# Patient Record
Sex: Male | Born: 1975 | Race: White | Hispanic: No | Smoking: Current every day smoker
Health system: Southern US, Community
[De-identification: ages and names within clinical notes are randomized; demographics above are authoritative.]

## PROBLEM LIST (undated history)

## (undated) DIAGNOSIS — F32A Depression, unspecified: Secondary | ICD-10-CM

## (undated) HISTORY — PX: CHOLECYSTECTOMY: SHX55

---

## 2005-04-09 ENCOUNTER — Other Ambulatory Visit: Payer: Self-pay

## 2005-04-09 ENCOUNTER — Emergency Department: Payer: Self-pay | Admitting: Emergency Medicine

## 2009-02-26 ENCOUNTER — Emergency Department: Payer: Self-pay | Admitting: Emergency Medicine

## 2014-09-02 ENCOUNTER — Emergency Department: Payer: Self-pay | Admitting: Emergency Medicine

## 2014-09-05 LAB — BETA STREP CULTURE(ARMC)

## 2020-06-25 ENCOUNTER — Emergency Department: Payer: Self-pay

## 2020-06-25 ENCOUNTER — Encounter: Payer: Self-pay | Admitting: Emergency Medicine

## 2020-06-25 ENCOUNTER — Emergency Department
Admission: EM | Admit: 2020-06-25 | Discharge: 2020-06-25 | Disposition: A | Discharge status: Home / Self Care | Payer: Self-pay | Attending: Emergency Medicine | Admitting: Emergency Medicine

## 2020-06-25 ENCOUNTER — Other Ambulatory Visit: Payer: Self-pay

## 2020-06-25 DIAGNOSIS — M545 Low back pain, unspecified: Secondary | ICD-10-CM | POA: Insufficient documentation

## 2020-06-25 DIAGNOSIS — F17200 Nicotine dependence, unspecified, uncomplicated: Secondary | ICD-10-CM | POA: Insufficient documentation

## 2020-06-25 MED ORDER — BACLOFEN 5 MG PO TABS: 5.0000 mg | ORAL_TABLET | Freq: Three times a day (TID) | ORAL | 0 refills | Status: AC | PRN

## 2020-06-25 MED ORDER — KETOROLAC TROMETHAMINE 30 MG/ML IJ SOLN
30.0000 mg | Freq: Once | INTRAMUSCULAR | Status: AC
  Administered 2020-06-25: 30 mg via INTRAMUSCULAR
  Filled 2020-06-25: qty 1

## 2020-06-25 MED ORDER — LIDOCAINE 5 % EX PTCH
1.0000 | MEDICATED_PATCH | CUTANEOUS | Status: DC
  Administered 2020-06-25: 1 via TRANSDERMAL
  Filled 2020-06-25: qty 1

## 2020-06-25 MED ORDER — LIDOCAINE 5 % EX PTCH: 1.0000 | MEDICATED_PATCH | Freq: Two times a day (BID) | CUTANEOUS | 0 refills | Status: AC

## 2020-06-25 NOTE — ED Triage Notes (Signed)
Pt c/o lower back pain x 2 weeks, and has been increasingly worse x 1 week. Pt states has tried heat, OTC pain relief without relief. Pt A&O, visualized in NAD at this time.

## 2020-06-25 NOTE — ED Provider Notes (Signed)
[facility] Emergency Department Provider Note  ____________________________________________  Time seen: Approximately 3:29 PM  I have reviewed the triage vital signs and the nursing notes.   HISTORY  Chief Complaint Back Pain    HPI Chris Walsh is a 44 y.o. male that presents to the emergency department for evaluation of low back pain for 2 weeks.  Patient states that at first pain was intermittent, and for the last week pain has been constant.  Pain is throbbing throughout his low back but does not radiate.  Pain improves when he uses his hands to provide support to his back.  Pain is worse with movement.  He has difficulty getting comfortable when going to bed due to discomfort.  He has taken Aleve without relief.  No trauma.  No bowel or bladder dysfunction or saddle anesthesias.  No nausea, vomiting, abdominal pain, urinary symptoms, leg pain, numbness, tingling.   History reviewed. No pertinent past medical history.  There are no problems to display for this patient.   Past Surgical History:  Procedure Laterality Date  . CHOLECYSTECTOMY      Prior to Admission medications   Medication Sig Start Date End Date Taking? Authorizing Provider  Baclofen 5 MG TABS Take 5 mg by mouth 3 (three) times daily as needed. 06/25/20   Enid Derry, PA-C  lidocaine (LIDODERM) 5 % Place 1 patch onto the skin every 12 (twelve) hours. Remove & Discard patch within 12 hours or as directed by MD 06/25/20 06/25/21  Enid Derry, PA-C    Allergies Patient has no known allergies.  No family history on file.  Social History Social History   Tobacco Use  . Smoking status: Current Every Day Smoker  . Smokeless tobacco: Never Used  Substance Use Topics  . Alcohol use: Not Currently  . Drug use: Not Currently     Review of Systems  Constitutional: No fever/chills Cardiovascular: No chest pain. Respiratory: No SOB. Gastrointestinal: No abdominal pain.  No  nausea, no vomiting.  Genitourinary: Negative for dysuria. Musculoskeletal: Positive for low back pain. Skin: Negative for rash, abrasions, lacerations, ecchymosis. Neurological: Negative for headaches, numbness or tingling   ____________________________________________   PHYSICAL EXAM:  VITAL SIGNS: ED Triage Vitals  Enc Vitals Group     BP 06/25/20 1434 134/85     Pulse Rate 06/25/20 1434 96     Resp 06/25/20 1434 20     Temp 06/25/20 1434 98.1 F (36.7 C)     Temp Source 06/25/20 1434 Oral     SpO2 06/25/20 1434 97 %     Weight 06/25/20 1432 190 lb (86.2 kg)     Height 06/25/20 1432 5\' 10"  (1.778 m)     Head Circumference --      Peak Flow --      Pain Score 06/25/20 1432 8     Pain Loc --      Pain Edu? --      Excl. in GC? --      Constitutional: Alert and oriented. Well appearing and in no acute distress. Eyes: Conjunctivae are normal. PERRL. EOMI. Head: Atraumatic. ENT:      Ears:      Nose: No congestion/rhinnorhea.      Mouth/Throat: Mucous membranes are moist.  Neck: No stridor.   Cardiovascular: Normal rate, regular rhythm.  Good peripheral circulation. Respiratory: Normal respiratory effort without tachypnea or retractions. Lungs CTAB. Good air entry to the bases with no decreased or absent breath sounds. Gastrointestinal: Bowel  sounds 4 quadrants. Soft and nontender to palpation. No guarding or rigidity. No palpable masses. No distention.  Musculoskeletal: Full range of motion to all extremities. No gross deformities appreciated.  Mild diffuse tenderness to palpation throughout lumbar spine and lumbar paraspinal muscles.  Strength equal in lower extremities bilaterally.  Normal gait. Neurologic:  Normal speech and language. No gross focal neurologic deficits are appreciated.  Skin:  Skin is warm, dry and intact. No rash noted. Psychiatric: Mood and affect are normal. Speech and behavior are normal. Patient exhibits appropriate insight and  judgement.   ____________________________________________   LABS (all labs ordered are listed, but only abnormal results are displayed)  Labs Reviewed - No data to display ____________________________________________  EKG   ____________________________________________  RADIOLOGY Lexine Baton, personally viewed and evaluated these images (plain radiographs) as part of my medical decision making, as well as reviewing the written report by the radiologist.  DG Lumbar Spine 2-3 Views  Result Date: 06/25/2020 CLINICAL DATA:  Back pain for 2 weeks EXAM: LUMBAR SPINE - 2-3 VIEW COMPARISON:  None. FINDINGS: Lumbar alignment is normal. Vertebral body heights are maintained. Mild degenerative changes at L3-L4 and L4-L5. IMPRESSION: Mild degenerative changes at L3-L4 and L4-L5. Electronically Signed   By: Jasmine Pang M.D.   On: 06/25/2020 16:05    ____________________________________________    PROCEDURES  Procedure(s) performed:    Procedures    Medications  ketorolac (TORADOL) 30 MG/ML injection 30 mg (30 mg Intramuscular Given 06/25/20 1635)     ____________________________________________   INITIAL IMPRESSION / ASSESSMENT AND PLAN / ED COURSE  Pertinent labs & imaging results that were available during my care of the patient were reviewed by me and considered in my medical decision making (see chart for details).  Review of the [place] CSRS was performed in accordance of the [facility] prior to dispensing any controlled drugs.     Patient presented to emergency department for evaluation of low back pain.  Vital signs and exam are reassuring.  X-ray negative for acute bony abnormality.  Patient was given IM Toradol for pain.  Patient will be discharged home with prescriptions for baclofen and Lidoderm. Patient is to follow up with primary care as directed. Patient is given ED precautions to return to the ED for any worsening or new symptoms.   Chris Walsh was  evaluated in Emergency Department on 06/25/2020 for the symptoms described in the history of present illness. He was evaluated in the context of the global COVID-19 pandemic, which necessitated consideration that the patient might be at risk for infection with the SARS-CoV-2 virus that causes COVID-19. Institutional protocols and algorithms that pertain to the evaluation of patients at risk for COVID-19 are in a state of rapid change based on information released by regulatory bodies including the CDC and federal and state organizations. These policies and algorithms were followed during the patient's care in the ED.  ____________________________________________  FINAL CLINICAL IMPRESSION(S) / ED DIAGNOSES  Final diagnoses:  Acute bilateral low back pain without sciatica      NEW MEDICATIONS STARTED DURING THIS VISIT:  ED Discharge Orders         Ordered    Baclofen 5 MG TABS  3 times daily PRN        06/25/20 1703    lidocaine (LIDODERM) 5 %  Every 12 hours        06/25/20 1703              This chart was dictated  using voice recognition software/Dragon. Despite best efforts to proofread, errors can occur which can change the meaning. Any change was purely unintentional.    Enid Derry, PA-C 06/25/20 2300    Phineas Semen, MD 06/26/20 [phone]

## 2020-06-25 NOTE — ED Triage Notes (Signed)
Pt called for triage, no response. 

## 2020-08-17 ENCOUNTER — Other Ambulatory Visit: Payer: Self-pay

## 2020-08-17 ENCOUNTER — Emergency Department
Admission: EM | Admit: 2020-08-17 | Discharge: 2020-08-18 | Disposition: A | Discharge status: Home / Self Care | Payer: Self-pay | Attending: Emergency Medicine | Admitting: Emergency Medicine

## 2020-08-17 ENCOUNTER — Encounter: Payer: Self-pay | Admitting: Emergency Medicine

## 2020-08-17 DIAGNOSIS — Z20822 Contact with and (suspected) exposure to covid-19: Secondary | ICD-10-CM | POA: Insufficient documentation

## 2020-08-17 DIAGNOSIS — F1490 Cocaine use, unspecified, uncomplicated: Secondary | ICD-10-CM | POA: Insufficient documentation

## 2020-08-17 DIAGNOSIS — F17200 Nicotine dependence, unspecified, uncomplicated: Secondary | ICD-10-CM | POA: Insufficient documentation

## 2020-08-17 DIAGNOSIS — R45851 Suicidal ideations: Secondary | ICD-10-CM | POA: Insufficient documentation

## 2020-08-17 DIAGNOSIS — F1410 Cocaine abuse, uncomplicated: Secondary | ICD-10-CM

## 2020-08-17 DIAGNOSIS — F329 Major depressive disorder, single episode, unspecified: Secondary | ICD-10-CM

## 2020-08-17 HISTORY — DX: Depression, unspecified: F32.A

## 2020-08-17 LAB — COMPREHENSIVE METABOLIC PANEL
ALT: 21 U/L (ref 0–44)
AST: 22 U/L (ref 15–41)
Albumin: 3.7 g/dL (ref 3.5–5.0)
Alkaline Phosphatase: 69 U/L (ref 38–126)
Anion gap: 11 (ref 5–15)
BUN: 9 mg/dL (ref 6–20)
CO2: 24 mmol/L (ref 22–32)
Calcium: 8.9 mg/dL (ref 8.9–10.3)
Chloride: 99 mmol/L (ref 98–111)
Creatinine, Ser: 1.09 mg/dL (ref 0.61–1.24)
GFR, Estimated: >60 mL/min (ref >60)
Glucose, Bld: 135 mg/dL — ABNORMAL HIGH (ref 70–99)
Potassium: 3.6 mmol/L (ref 3.5–5.1)
Sodium: 134 mmol/L — ABNORMAL LOW (ref 135–145)
Total Bilirubin: 0.7 mg/dL (ref 0.3–1.2)
Total Protein: 6.7 g/dL (ref 6.5–8.1)

## 2020-08-17 LAB — CBC
HCT: 45.6 % (ref 39.0–52.0)
Hemoglobin: 16.7 g/dL (ref 13.0–17.0)
MCH: 31.9 pg (ref 26.0–34.0)
MCHC: 36.6 g/dL — ABNORMAL HIGH (ref 30.0–36.0)
MCV: 87.2 fL (ref 80.0–100.0)
Platelets: 330 10*3/uL (ref 150–400)
RBC: 5.23 MIL/uL (ref 4.22–5.81)
RDW: 11.7 % (ref 11.5–15.5)
WBC: 17.8 10*3/uL — ABNORMAL HIGH (ref 4.0–10.5)
nRBC: 0 % (ref 0.0–0.2)

## 2020-08-17 LAB — RESP PANEL BY RT-PCR (FLU A&B, COVID) ARPGX2
Influenza A by PCR: NEGATIVE
Influenza B by PCR: NEGATIVE
SARS Coronavirus 2 by RT PCR: NEGATIVE

## 2020-08-17 LAB — SALICYLATE LEVEL: Salicylate Lvl: <7 mg/dL — ABNORMAL LOW (ref 7.0–30.0)

## 2020-08-17 LAB — URINE DRUG SCREEN, QUALITATIVE (ARMC ONLY)
Amphetamines, Ur Screen: NOT DETECTED
Barbiturates, Ur Screen: NOT DETECTED
Benzodiazepine, Ur Scrn: NOT DETECTED
Cannabinoid 50 Ng, Ur ~~LOC~~: NOT DETECTED
Cocaine Metabolite,Ur ~~LOC~~: POSITIVE — AB
MDMA (Ecstasy)Ur Screen: NOT DETECTED
Methadone Scn, Ur: NOT DETECTED
Opiate, Ur Screen: NOT DETECTED
Phencyclidine (PCP) Ur S: NOT DETECTED
Tricyclic, Ur Screen: NOT DETECTED

## 2020-08-17 LAB — ETHANOL: Alcohol, Ethyl (B): <10 mg/dL (ref <10)

## 2020-08-17 LAB — ACETAMINOPHEN LEVEL: Acetaminophen (Tylenol), Serum: <10 ug/mL — ABNORMAL LOW (ref 10–30)

## 2020-08-17 NOTE — ED Notes (Signed)
RN in to assess pt. Pt asleep at this time, writer will assess upon pt waking or when immediate assessment indicated

## 2020-08-17 NOTE — ED Triage Notes (Signed)
Pt reports has been feeling really depressed. Pt states lost his dad recently and has had thoughts of self harm, denies that currently, denies plan. Pt looking down when taking to RN

## 2020-08-17 NOTE — BH Assessment (Signed)
Assessment Note  Chris Walsh is an 44 y.o. male presenting to [facility] ED voluntarily for SI. Per triage note Pt reports has been feeling really depressed. Pt states lost his dad recently and has had thoughts of self harm, denies that currently, denies plan. During assessment patient appears alert and oriented x4, calm and cooperative, appears depressed and sad. Patient reports why he is presenting to the ED "I'm going through a lot, my dad passed of Cancer in September." Patient also reports "I used to see a Psychiatrist that wanted to start me on medication but I didn't follow through with it." Patient reports if discharged he will follow through with outpatient treatment. Patient reports the reason he came to the ED tonight "I live with my mom and I was telling her that I felt like hurting myself, I've been isolating myself." Patient also reports occasional Cocaine use. Patient UDS positive for Cocaine. Patient denies current SI/HI/AH/VH and does not appear to be responding to internal or external stimuli.  Per Psyc NP Lerry Liner patient will be observed overnight and reassessed in the morning  Diagnosis: Depression, Cocaine Use  Past Medical History:  Past Medical History:  Diagnosis Date  . Depression     Past Surgical History:  Procedure Laterality Date  . CHOLECYSTECTOMY      Family History: No family history on file.  Social History:  reports that he has been smoking. He has never used smokeless tobacco. He reports previous alcohol use. He reports previous drug use.  Additional Social History:  Alcohol / Drug Use Pain Medications: See MAR Prescriptions: See MAR Over the Counter: See MAR History of alcohol / drug use?: Yes Substance #1 Name of Substance 1: Cocaine  CIWA: CIWA-Ar BP: (!) 146/71 Pulse Rate: 95 COWS:    Allergies: No Known Allergies  Home Medications: (Not in a hospital admission)   OB/GYN Status:  No LMP for male patient.  General Assessment  Data Location of Assessment: [facility] ED TTS Assessment: In system Is this a Tele or Face-to-Face Assessment?: Face-to-Face Is this an Initial Assessment or a Re-assessment for this encounter?: Initial Assessment Patient Accompanied by:: N/A Language Other than English: No Living Arrangements: Other (Comment) What gender do you identify as?: Male Marital status: Single Pregnancy Status: No Living Arrangements: Parent Can pt return to current living arrangement?: Yes Admission Status: Voluntary Is patient capable of signing voluntary admission?: Yes Referral Source: Self/Family/Friend Insurance type: None  Medical Screening Exam [facility] Walk-in ONLY) Medical Exam completed: Yes  Crisis Care Plan Living Arrangements: Parent Legal Guardian: Other: (Self) Name of Psychiatrist: None Name of Therapist: None  Education Status Is patient currently in school?: No Is the patient employed, unemployed or receiving disability?: Unemployed  Risk to self with the past 6 months Suicidal Ideation: No-Not Currently/Within Last 6 Months Has patient been a risk to self within the past 6 months prior to admission? : Yes Suicidal Intent: No-Not Currently/Within Last 6 Months Has patient had any suicidal intent within the past 6 months prior to admission? : No Is patient at risk for suicide?: No Suicidal Plan?: No Has patient had any suicidal plan within the past 6 months prior to admission? : No Access to Means: No What has been your use of drugs/alcohol within the last 12 months?: Cocaine Previous Attempts/Gestures: No How many times?: 0 Other Self Harm Risks: None Triggers for Past Attempts: None known Intentional Self Injurious Behavior: None Family Suicide History: Unknown Recent stressful life event(s): Other (Comment) (Loss  of family member) Persecutory voices/beliefs?: No Depression: Yes Depression Symptoms: Isolating,Insomnia,Loss of interest in usual pleasures,Feeling worthless/self  pity Substance abuse history and/or treatment for substance abuse?: Yes Suicide prevention information given to non-admitted patients: Not applicable  Risk to Others within the past 6 months Homicidal Ideation: No Does patient have any lifetime risk of violence toward others beyond the six months prior to admission? : No Thoughts of Harm to Others: No Current Homicidal Intent: No Current Homicidal Plan: No Access to Homicidal Means: No Identified Victim: None History of harm to others?: No Assessment of Violence: None Noted Violent Behavior Description: None Does patient have access to weapons?: No Criminal Charges Pending?: No Does patient have a court date: No Is patient on probation?: No  Psychosis Hallucinations: None noted Delusions: None noted  Mental Status Report Appearance/Hygiene: In scrubs Eye Contact: Good Motor Activity: Freedom of movement Speech: Logical/coherent Level of Consciousness: Alert Mood: Depressed Affect: Appropriate to circumstance Anxiety Level: None Thought Processes: Coherent Judgement: Unimpaired Orientation: Person,Place,Time,Situation,Appropriate for developmental age Obsessive Compulsive Thoughts/Behaviors: None  Cognitive Functioning Concentration: Normal Memory: Recent Intact,Remote Intact Is patient IDD: No Insight: Good Impulse Control: Good Appetite: Good Have you had any weight changes? : No Change Sleep: Decreased Total Hours of Sleep: 5 Vegetative Symptoms: None  ADLScreening [facility] Assessment Services) Patient's cognitive ability adequate to safely complete daily activities?: Yes Patient able to express need for assistance with ADLs?: Yes Independently performs ADLs?: Yes (appropriate for developmental age)  Prior Inpatient Therapy Prior Inpatient Therapy: No  Prior Outpatient Therapy Prior Outpatient Therapy: No Does patient have an ACCT team?: No Does patient have Intensive In-House Services?  : No Does patient  have [facility] services? : No Does patient have P4CC services?: No  ADL Screening (condition at time of admission) Patient's cognitive ability adequate to safely complete daily activities?: Yes Is the patient deaf or have difficulty hearing?: No Does the patient have difficulty seeing, even when wearing glasses/contacts?: No Does the patient have difficulty concentrating, remembering, or making decisions?: No Patient able to express need for assistance with ADLs?: Yes Does the patient have difficulty dressing or bathing?: No Independently performs ADLs?: Yes (appropriate for developmental age) Does the patient have difficulty walking or climbing stairs?: No Weakness of Legs: None Weakness of Arms/Hands: None  Home Assistive Devices/Equipment Home Assistive Devices/Equipment: None  Therapy Consults (therapy consults require a physician order) PT Evaluation Needed: No OT Evalulation Needed: No SLP Evaluation Needed: No                  Disposition: Per Psyc NP Rashaun Dixon patient will be observed overnight and reassessed in the morning Disposition Initial Assessment Completed for this Encounter: Yes  On Site Evaluation by:   Reviewed with Physician:    Benay Pike MS LCASA 08/17/2020 9:26 PM

## 2020-08-17 NOTE — BH Assessment (Signed)
TTS unable to assess, as patient is sleeping and cannot be aroused enough to participate with the assessment.  

## 2020-08-17 NOTE — ED Notes (Signed)
Pt given dinner tray.

## 2020-08-17 NOTE — ED Provider Notes (Signed)
[facility] Emergency Department Provider Note   ____________________________________________    I have reviewed the triage vital signs and the nursing notes.   HISTORY  Chief Complaint Depression     HPI Chris Walsh is a 44 y.o. male with a history of depression who presents today with complaints of intermittent feelings of depression over the last month.  He has had vague thoughts of harming himself but reports he would not act on them.  He reports when he feels this way he talks with his mother which usually makes him feel better, she recommended he come speak to someone today.  He notes he has been hospitalized before in [place] but not [place] in [place].  Reports he used to see a psychiatrist but no longer.  Past Medical History:  Diagnosis Date  . Depression     There are no problems to display for this patient.   Past Surgical History:  Procedure Laterality Date  . CHOLECYSTECTOMY      Prior to Admission medications   Medication Sig Start Date End Date Taking? Authorizing Provider  Baclofen 5 MG TABS Take 5 mg by mouth 3 (three) times daily as needed. 06/25/20   Enid Derry, PA-C  lidocaine (LIDODERM) 5 % Place 1 patch onto the skin every 12 (twelve) hours. Remove & Discard patch within 12 hours or as directed by MD 06/25/20 06/25/21  Enid Derry, PA-C     Allergies Patient has no known allergies.  No family history on file.  Social History Social History   Tobacco Use  . Smoking status: Current Every Day Smoker  . Smokeless tobacco: Never Used  Substance Use Topics  . Alcohol use: Not Currently  . Drug use: Not Currently    Review of Systems  Constitutional: No fever/chills Eyes: No visual changes.  ENT: No sore throat. Cardiovascular: Denies chest pain. Respiratory: Denies shortness of breath. Gastrointestinal: No abdominal pain.    Genitourinary: Negative for dysuria. Musculoskeletal: Negative for  back pain. Skin: Negative for rash. Neurological: Negative for headaches or weakness   ____________________________________________   PHYSICAL EXAM:  VITAL SIGNS: ED Triage Vitals  Enc Vitals Group     BP 08/17/20 1433 (!) 146/71     Pulse Rate 08/17/20 1433 95     Resp 08/17/20 1433 20     Temp 08/17/20 1434 97.8 F (36.6 C)     Temp Source 08/17/20 1434 Oral     SpO2 08/17/20 1433 96 %     Weight 08/17/20 1433 88.5 kg (195 lb)     Height 08/17/20 1433 1.676 m (5\' 6" )     Head Circumference --      Peak Flow --      Pain Score 08/17/20 1433 0     Pain Loc --      Pain Edu? --      Excl. in GC? --     Constitutional: Alert and oriented.   Nose: No congestion/rhinnorhea. Mouth/Throat: Mucous membranes are moist.   Neck:  Painless ROM Cardiovascular: Normal rate, regular rhythm. Grossly normal heart sounds.  Good peripheral circulation. Respiratory: Normal respiratory effort.  No retractions. Lungs CTAB. Gastrointestinal: Soft and nontender. No distention.  No CVA tenderness. Genitourinary: deferred Musculoskeletal: No lower extremity tenderness nor edema.  Warm and well perfused Neurologic:  Normal speech and language. No gross focal neurologic deficits are appreciated.  Skin:  Skin is warm, dry and intact. No rash noted. Psychiatric: Mood and affect are  normal. Speech and behavior are normal.  Overall reassuring exam  ____________________________________________   LABS (all labs ordered are listed, but only abnormal results are displayed)  Labs Reviewed  COMPREHENSIVE METABOLIC PANEL - Abnormal; Notable for the following components:      Result Value   Sodium 134 (*)    Glucose, Bld 135 (*)    All other components within normal limits  SALICYLATE LEVEL - Abnormal; Notable for the following components:   Salicylate Lvl <7.0 (*)    All other components within normal limits  ACETAMINOPHEN LEVEL - Abnormal; Notable for the following components:   Acetaminophen  (Tylenol), Serum <10 (*)    All other components within normal limits  CBC - Abnormal; Notable for the following components:   WBC 17.8 (*)    MCHC 36.6 (*)    All other components within normal limits  URINE DRUG SCREEN, QUALITATIVE ([facility] ONLY) - Abnormal; Notable for the following components:   Cocaine Metabolite,[place] POSITIVE (*)    All other components within normal limits  RESP PANEL BY RT-PCR (FLU A&B, COVID) ARPGX2  ETHANOL   ____________________________________________  EKG   ____________________________________________  RADIOLOGY   ____________________________________________   PROCEDURES  Procedure(s) performed: No  Procedures   Critical Care performed: No ____________________________________________   INITIAL IMPRESSION / ASSESSMENT AND PLAN / ED COURSE  Pertinent labs & imaging results that were available during my care of the patient were reviewed by me and considered in my medical decision making (see chart for details).  Patient presents with complaints of depression, intermittent SI over the last month.  No current thoughts of hurting himself, requesting to speak to behavioral team.  He does contract for safety do not engage in to be an immediate threat to himself, did not believe involuntary commitment is indicated at this time.  Have consulted TTS and psychiatry.  Patient with elevated white blood cell count of unknown etiology, exam is quite reassuring, cocaine noted on UDS.  The patient has been placed in psychiatric observation due to the need to provide a safe environment for the patient while obtaining psychiatric consultation and evaluation, as well as ongoing medical and medication management to treat the patient's condition.  The patient has not been placed under full IVC at this time.     ____________________________________________   FINAL CLINICAL IMPRESSION(S) / ED DIAGNOSES  Final diagnoses:  Suicidal ideation        Note:   This document was prepared using Dragon voice recognition software and may include unintentional dictation errors.   Jene Every, MD 08/17/20 1550

## 2020-08-17 NOTE — ED Notes (Signed)
TTS at bedside pt asleep and unable to be assessed at this time

## 2020-08-18 DIAGNOSIS — F331 Major depressive disorder, recurrent, moderate: Secondary | ICD-10-CM

## 2020-08-18 DIAGNOSIS — F332 Major depressive disorder, recurrent severe without psychotic features: Secondary | ICD-10-CM

## 2020-08-18 DIAGNOSIS — R45851 Suicidal ideations: Secondary | ICD-10-CM | POA: Insufficient documentation

## 2020-08-18 DIAGNOSIS — F1410 Cocaine abuse, uncomplicated: Secondary | ICD-10-CM

## 2020-08-18 DIAGNOSIS — F329 Major depressive disorder, single episode, unspecified: Secondary | ICD-10-CM

## 2020-08-18 MED ORDER — QUETIAPINE FUMARATE 100 MG PO TABS: 100.0000 mg | ORAL_TABLET | Freq: Every day | ORAL | 1 refills | Status: AC

## 2020-08-18 NOTE — ED Provider Notes (Signed)
Emergency Medicine Observation Re-evaluation Note  Chris Walsh is a 44 y.o. male, seen on rounds today.  Pt initially presented to the ED for complaints of Depression Currently, the patient is asleep.  Physical Exam  BP 117/61 (BP Location: Left Arm)   Pulse 74   Temp 97.8 F (36.6 C) (Oral)   Resp 18   Ht 5\' 6"  (1.676 m)   Wt 88.5 kg   SpO2 96%   BMI 31.47 kg/m  Physical Exam General: No acute distress Cardiac: Well-perfused extremities Lungs: No respiratory distress Psych: Appropriate mood and affect ED Course / MDM  EKG:    I have reviewed the labs performed to date as well as medications administered while in observation.  Recent changes in the last 24 hours include none.  Plan  Current plan is for reassessment in the morning. Patient is not under full IVC at this time.   , MD 08/18/20 [phone]

## 2020-08-18 NOTE — BH Assessment (Signed)
TTS completed reassessment with NP student Orvilla Fus). Pt presents calm, alert, oriented x 4 and cooperative. Pt reports to feel a lot better today and expressed the need for OPT resources. Pt reports to need OPT resources to address his depression symptoms and recent loss (Dad). Pt was provided a list of OPT resources including [facility]. Pt denies any SI/HI/AH/VH and contracts for safety.   Per Dr. Toni Amend pt will be discharged with the recommendation to follow up with resources provided

## 2020-08-18 NOTE — ED Provider Notes (Signed)
-----------------------------------------   10:19 AM on 08/18/2020 -----------------------------------------  Patient has been seen and evaluated by psychiatry.  They believe the patient is safe for discharge home from a psychiatric standpoint.  Patient's medical work-up has been nonrevealing.  Psychiatry is discharging with a Seroquel prescription and [facility] follow-up.   Minna Antis, MD 08/18/20 1020

## 2020-08-18 NOTE — ED Notes (Signed)
Pt given a cup of sprite per request.  

## 2020-08-18 NOTE — ED Notes (Signed)
Pt given breakfast tray

## 2020-08-18 NOTE — Consult Note (Signed)
[facility] Face-to-Face Psychiatry Consult   Reason for Consult:  Consult for this 44 year old man who came voluntarily to the emergency room with symptoms of mood instability Referring Physician:   Paduchowski Patient Identification: AFFAN CALLOW MRN:  [id number] Principal Diagnosis: MDD (major depressive disorder) Diagnosis:  Principal Problem:   MDD (major depressive disorder) Active Problems:   Cocaine abuse (HCC)   Total Time spent with patient: 1 hour  Subjective:   TYRICE HEWITT is a 44 y.o. male patient admitted with "highs and lows".  HPI:   Patient seen chart reviewed.  44 year old man came to the emergency room complaining of mood instability.  Says that for the past year ever since his father died he has been having moods that go up and down.  Feels negative much of the time.  At the time he came into the emergency room he admitted that he was having suicidal ideation but did not have a specific plan and had not acted on it.  He reports that major stresses include the death of his father last year and that the holidays are depressing.  He also admits however that he has been using cocaine recently.  Mood up and down at times.  Sleep often inadequate.  Denies any psychotic symptoms.  Denies homicidal ideation.  Denies alcohol use.  Admits to ongoing cocaine use.  Not currently receiving any outpatient mental health treatment.  Not threatening to anyone else.  Past Psychiatric History: No previous hospitalization.  Never prescribed medication in the past.  Has been referred to [facility] in the past but did not follow-up with treatment.  No previous suicide attempts  Risk to Self: Suicidal Ideation: No-Not Currently/Within Last 6 Months Suicidal Intent: No-Not Currently/Within Last 6 Months Is patient at risk for suicide?: No Suicidal Plan?: No Access to Means: No What has been your use of drugs/alcohol within the last 12 months?: Cocaine How many times?: 0 Other Self Harm Risks:  None Triggers for Past Attempts: None known Intentional Self Injurious Behavior: None Risk to Others: Homicidal Ideation: No Thoughts of Harm to Others: No Current Homicidal Intent: No Current Homicidal Plan: No Access to Homicidal Means: No Identified Victim: None History of harm to others?: No Assessment of Violence: None Noted Violent Behavior Description: None Does patient have access to weapons?: No Criminal Charges Pending?: No Does patient have a court date: No Prior Inpatient Therapy: Prior Inpatient Therapy: No Prior Outpatient Therapy: Prior Outpatient Therapy: No Does patient have an ACCT team?: No Does patient have Intensive In-House Services?  : No Does patient have [facility] services? : No Does patient have P4CC services?: No  Past Medical History:  Past Medical History:  Diagnosis Date  . Depression     Past Surgical History:  Procedure Laterality Date  . CHOLECYSTECTOMY     Family History: No family history on file. Family Psychiatric  History: Reports that his daughter has bipolar disorder and psychotic symptoms Social History:  Social History   Substance and Sexual Activity  Alcohol Use Not Currently     Social History   Substance and Sexual Activity  Drug Use Not Currently    Social History   Socioeconomic History  . Marital status: Legally Separated    Spouse name: Not on file  . Number of children: Not on file  . Years of education: Not on file  . Highest education level: Not on file  Occupational History  . Not on file  Tobacco Use  . Smoking status: Current  Every Day Smoker  . Smokeless tobacco: Never Used  Substance and Sexual Activity  . Alcohol use: Not Currently  . Drug use: Not Currently  . Sexual activity: Not on file  Other Topics Concern  . Not on file  Social History Narrative  . Not on file   Social Determinants of Health   Financial Resource Strain: Not on file  Food Insecurity: Not on file  Transportation Needs:  Not on file  Physical Activity: Not on file  Stress: Not on file  Social Connections: Not on file   Additional Social History:    Allergies:  No Known Allergies  Labs:  Results for orders placed or performed during the hospital encounter of 08/17/20 (from the past 48 hour(s))  Comprehensive metabolic panel     Status: Abnormal   Collection Time: 08/17/20  2:39 PM  Result Value Ref Range   Sodium 134 (L) 135 - 145 mmol/L   Potassium 3.6 3.5 - 5.1 mmol/L   Chloride 99 98 - 111 mmol/L   CO2 24 22 - 32 mmol/L   Glucose, Bld 135 (H) 70 - 99 mg/dL    Comment: Glucose reference range applies only to samples taken after fasting for at least 8 hours.   BUN 9 6 - 20 mg/dL   Creatinine, Ser 4.09 0.61 - 1.24 mg/dL   Calcium 8.9 8.9 - 81.1 mg/dL   Total Protein 6.7 6.5 - 8.1 g/dL   Albumin 3.7 3.5 - 5.0 g/dL   AST 22 15 - 41 U/L   ALT 21 0 - 44 U/L   Alkaline Phosphatase 69 38 - 126 U/L   Total Bilirubin 0.7 0.3 - 1.2 mg/dL   GFR, Estimated >91 >47 mL/min    Comment: (NOTE) Calculated using the CKD-EPI Creatinine Equation (2021)    Anion gap 11 5 - 15    Comment: Performed at [facility], [address] [place], [place] [address]  Ethanol     Status: None   Collection Time: 08/17/20  2:39 PM  Result Value Ref Range   Alcohol, Ethyl (B) <10 <10 mg/dL    Comment: (NOTE) Lowest detectable limit for serum alcohol is 10 mg/dL.  For medical purposes only. Performed at [facility], [address] [place], [place] [address]   Salicylate level     Status: Abnormal   Collection Time: 08/17/20  2:39 PM  Result Value Ref Range   Salicylate Lvl <7.0 (L) 7.0 - 30.0 mg/dL    Comment: Performed at [facility], [address] [place], [place] [address]  Acetaminophen level     Status: Abnormal   Collection Time: 08/17/20  2:39 PM  Result Value Ref Range   Acetaminophen (Tylenol), Serum <10 (L) 10 - 30 ug/mL    Comment: (NOTE) Therapeutic concentrations  vary significantly. A range of 10-30 ug/mL  may be an effective concentration for many patients. However, some  are best treated at concentrations outside of this range. Acetaminophen concentrations >150 ug/mL at 4 hours after ingestion  and >50 ug/mL at 12 hours after ingestion are often associated with  toxic reactions.  Performed at [facility], [address] [place], [place] [address]   cbc     Status: Abnormal   Collection Time: 08/17/20  2:39 PM  Result Value Ref Range   WBC 17.8 (H) 4.0 - 10.5 K/uL   RBC 5.23 4.22 - 5.81 MIL/uL   Hemoglobin 16.7 13.0 - 17.0 g/dL   HCT 45.6  39.0 - 52.0 %   MCV 87.2 80.0 - 100.0 fL   MCH 31.9 26.0 - 34.0 pg   MCHC 36.6 (H) 30.0 - 36.0 g/dL   RDW 62.9 52.8 - 41.3 %   Platelets 330 150 - 400 K/uL   nRBC 0.0 0.0 - 0.2 %    Comment: Performed at [facility], [address] [place], [place] [address]  Urine Drug Screen, Qualitative     Status: Abnormal   Collection Time: 08/17/20  2:39 PM  Result Value Ref Range   Tricyclic, Ur Screen NONE DETECTED NONE DETECTED   Amphetamines, Ur Screen NONE DETECTED NONE DETECTED   MDMA (Ecstasy)Ur Screen NONE DETECTED NONE DETECTED   Cocaine Metabolite,Ur [place] POSITIVE (A) NONE DETECTED   Opiate, Ur Screen NONE DETECTED NONE DETECTED   Phencyclidine (PCP) Ur S NONE DETECTED NONE DETECTED   Cannabinoid 50 Ng, Ur [place] NONE DETECTED NONE DETECTED   Barbiturates, Ur Screen NONE DETECTED NONE DETECTED   Benzodiazepine, Ur Scrn NONE DETECTED NONE DETECTED   Methadone Scn, Ur NONE DETECTED NONE DETECTED    Comment: (NOTE) Tricyclics + metabolites, urine    Cutoff 1000 ng/mL Amphetamines + metabolites, urine  Cutoff 1000 ng/mL MDMA (Ecstasy), urine              Cutoff 500 ng/mL Cocaine Metabolite, urine          Cutoff 300 ng/mL Opiate + metabolites, urine        Cutoff 300 ng/mL Phencyclidine (PCP), urine         Cutoff 25 ng/mL Cannabinoid, urine                 Cutoff 50  ng/mL Barbiturates + metabolites, urine  Cutoff 200 ng/mL Benzodiazepine, urine              Cutoff 200 ng/mL Methadone, urine                   Cutoff 300 ng/mL  The urine drug screen provides only a preliminary, unconfirmed analytical test result and should not be used for non-medical purposes. Clinical consideration and professional judgment should be applied to any positive drug screen result due to possible interfering substances. A more specific alternate chemical method must be used in order to obtain a confirmed analytical result. Gas chromatography / mass spectrometry (GC/MS) is the preferred confirm atory method. Performed at [facility], [address] [place], [place] [address]   Resp Panel by RT-PCR (Flu A&B, Covid) Nasopharyngeal Swab     Status: None   Collection Time: 08/17/20  3:45 PM   Specimen: Nasopharyngeal Swab; Nasopharyngeal(NP) swabs in vial transport medium  Result Value Ref Range   SARS Coronavirus 2 by RT PCR NEGATIVE NEGATIVE    Comment: (NOTE) SARS-CoV-2 target nucleic acids are NOT DETECTED.  The SARS-CoV-2 RNA is generally detectable in upper respiratory specimens during the acute phase of infection. The lowest concentration of SARS-CoV-2 viral copies this assay can detect is 138 copies/mL. A negative result does not preclude SARS-Cov-2 infection and should not be used as the sole basis for treatment or other patient management decisions. A negative result may occur with  improper specimen collection/handling, submission of specimen other than nasopharyngeal swab, presence of viral mutation(s) within the areas targeted by this assay, and inadequate number of viral copies(<138 copies/mL). A negative result must be combined with clinical observations, patient history, and epidemiological information. The expected result is Negative.  Fact Sheet for Patients:  BloggerCourse.com  Fact Sheet for Healthcare  Providers:  SeriousBroker.it  This test is no t yet approved or cleared by the [place] FDA and  has been authorized for detection and/or diagnosis of SARS-CoV-2 by FDA under an Emergency Use Authorization (EUA). This EUA will remain  in effect (meaning this test can be used) for the duration of the COVID-19 declaration under Section 564(b)(1) of the Act, 21 U.S.C.section 360bbb-3(b)(1), unless the authorization is terminated  or revoked sooner.       Influenza A by PCR NEGATIVE NEGATIVE   Influenza B by PCR NEGATIVE NEGATIVE    Comment: (NOTE) The Xpert Xpress SARS-CoV-2/FLU/RSV plus assay is intended as an aid in the diagnosis of influenza from Nasopharyngeal swab specimens and should not be used as a sole basis for treatment. Nasal washings and aspirates are unacceptable for Xpert Xpress SARS-CoV-2/FLU/RSV testing.  Fact Sheet for Patients: BloggerCourse.com  Fact Sheet for Healthcare Providers: SeriousBroker.it  This test is not yet approved or cleared by the [place] FDA and has been authorized for detection and/or diagnosis of SARS-CoV-2 by FDA under an Emergency Use Authorization (EUA). This EUA will remain in effect (meaning this test can be used) for the duration of the COVID-19 declaration under Section 564(b)(1) of the Act, 21 U.S.C. section 360bbb-3(b)(1), unless the authorization is terminated or revoked.  Performed at [facility], [address] [place], [place] [address]     No current facility-administered medications for this encounter.   Current Outpatient Medications  Medication Sig Dispense Refill  . Baclofen 5 MG TABS Take 5 mg by mouth 3 (three) times daily as needed. 15 tablet 0  . lidocaine (LIDODERM) 5 % Place 1 patch onto the skin every 12 (twelve) hours. Remove & Discard patch within 12 hours or as directed by MD 10 patch 0  . QUEtiapine (SEROQUEL)  100 MG tablet Take 1 tablet (100 mg total) by mouth at bedtime. 30 tablet 1    Musculoskeletal: Strength & Muscle Tone: within normal limits Gait & Station: normal Patient leans: N/A  Psychiatric Specialty Exam: Physical Exam Vitals and nursing note reviewed.  Constitutional:      Appearance: He is well-developed and well-nourished.  HENT:     Head: Normocephalic and atraumatic.  Eyes:     Conjunctiva/sclera: Conjunctivae normal.     Pupils: Pupils are equal, round, and reactive to light.  Cardiovascular:     Heart sounds: Normal heart sounds.  Pulmonary:     Effort: Pulmonary effort is normal.  Abdominal:     Palpations: Abdomen is soft.  Musculoskeletal:        General: Normal range of motion.     Cervical back: Normal range of motion.  Skin:    General: Skin is warm and dry.  Neurological:     General: No focal deficit present.     Mental Status: He is alert.  Psychiatric:        Attention and Perception: Attention normal.        Mood and Affect: Mood normal.        Speech: Speech normal.        Behavior: Behavior normal.        Thought Content: Thought content normal.        Cognition and Memory: Cognition normal.        Judgment: Judgment normal.     Review of Systems  Constitutional: Negative.   HENT: Negative.   Eyes: Negative.   Respiratory: Negative.  Cardiovascular: Negative.   Gastrointestinal: Negative.   Musculoskeletal: Negative.   Skin: Negative.   Neurological: Negative.   Psychiatric/Behavioral: Negative.     Blood pressure 104/61, pulse 82, temperature 98.8 F (37.1 C), temperature source Oral, resp. rate 20, height 5\' 6"  (1.676 m), weight 88.5 kg, SpO2 96 %.Body mass index is 31.47 kg/m.  General Appearance: Casual  Eye Contact:  Good  Speech:  Clear and Coherent  Volume:  Normal  Mood:  Euthymic  Affect:  Congruent  Thought Process:  Goal Directed  Orientation:  Full (Time, Place, and Person)  Thought Content:  Logical  Suicidal  Thoughts:  No  Homicidal Thoughts:  No  Memory:  Immediate;   Fair Recent;   Fair Remote;   Fair  Judgement:  Fair  Insight:  Fair  Psychomotor Activity:  Normal  Concentration:  Concentration: Fair  Recall:  [employer]iservFair  Fund of Knowledge:  Fair  Language:  Fair  Akathisia:  No  Handed:  Right  AIMS (if indicated):     Assets:  Desire for Improvement Housing Resilience  ADL's:  Intact  Cognition:  WNL  Sleep:        Treatment Plan Summary: Plan 44 year old man with symptoms of depression.  Possible bipolar disorder although it sounds like he does not have full psychotic symptoms.  Complicated by substance abuse.  Patient is currently calm pleasant smiling reports that he is feeling much better today.  Denies any current suicidal ideation.  He was happy to meet with the representative from [facility] and make plans for outpatient follow-up.  I suggested to him that we try starting Seroquel 100 mg at night for mood instability sleep problems.  Side effects reviewed.  Patient agreeable.  Case reviewed with emergency room doctor and TTS.  Patient can be discharged from the emergency room to local follow-up.  Disposition: No evidence of imminent risk to self or others at present.   Patient does not meet criteria for psychiatric inpatient admission. Supportive therapy provided about ongoing stressors. Discussed crisis plan, support from social network, calling 911, coming to the Emergency Department, and calling Suicide Hotline.  Mordecai RasmussenJohn Eion Timbrook, MD 08/18/2020 11:41 AM

## 2020-08-18 NOTE — ED Notes (Signed)
Report to bill, rn.  

## 2020-08-18 NOTE — Discharge Instructions (Addendum)
You have been seen in the emergency department for a  psychiatric concern. You have been evaluated both medically as well as psychiatrically. Please follow-up with your outpatient resources provided. Return to the emergency department for any worsening symptoms, or any thoughts of hurting yourself or anyone else so that we may attempt to help you. 

## 2020-08-18 NOTE — Consult Note (Signed)
[facility] Face-to-Face Psychiatry Consult   Reason for Consult:  Psychiatric evaluation Referring Physician:  Dr. Cyril Loosen  Patient Identification: Chris Walsh MRN:  [id number] Principal Diagnosis: MDD (major depressive disorder) Diagnosis:  Principal Problem:   MDD (major depressive disorder)   Total Time spent with patient: 45 minutes  Subjective:   Chris Walsh is a 44 y.o. male patient admitted to [facility] per triage nurse, pt reports has been feeling really depressed. Pt states lost his dad recently and has had thoughts of self harm, denies that currently, denies plan. Pt looking down when taking to RN  HPI:  Assessment  Chris Walsh, 44 y.o., male patient presented to [facility] for suicidal thoughts.  Patient seen face to face  by TTS and this provider; chart reviewed and consulted with Dr. Lucianne Muss on 08/18/20.  On evaluation Chris Walsh reports that he has had intermittent thoughts of suicide.  He states that he has been depressed for a few months.  He recants and becomes tearful when talking about his grandfathers death last year.  He says he knows its time to address his mental health and he is willing to seek outpatient services.    Recommendation:  Although patient, is denying SI currently, writer's recommendation is to be reassessed in the am to insure patient remains psychiatrically stable   Per TTS, during assessment patient appears alert and oriented x4, calm and cooperative, appears depressed and sad. Patient reports why he is presenting to the ED "I'm going through a lot, my dad passed of Cancer in September." Patient also reports "I used to see a Psychiatrist that wanted to start me on medication but I didn't follow through with it." Patient reports if discharged he will follow through with outpatient treatment. Patient reports the reason he came to the ED tonight "I live with my mom and I was telling her that I felt like hurting myself, I've been isolating myself." Patient  also reports occasional Cocaine use. Patient UDS positive for Cocaine. Patient denies current SI/HI/AH/VH and does not appear to be responding to internal or external stimuli.  Past Psychiatric History: MDD  Risk to Self: Suicidal Ideation: No-Not Currently/Within Last 6 Months Suicidal Intent: No-Not Currently/Within Last 6 Months Is patient at risk for suicide?: No Suicidal Plan?: No Access to Means: No What has been your use of drugs/alcohol within the last 12 months?: Cocaine How many times?: 0 Other Self Harm Risks: None Triggers for Past Attempts: None known Intentional Self Injurious Behavior: None Risk to Others: Homicidal Ideation: No Thoughts of Harm to Others: No Current Homicidal Intent: No Current Homicidal Plan: No Access to Homicidal Means: No Identified Victim: None History of harm to others?: No Assessment of Violence: None Noted Violent Behavior Description: None Does patient have access to weapons?: No Criminal Charges Pending?: No Does patient have a court date: No Prior Inpatient Therapy: Prior Inpatient Therapy: No Prior Outpatient Therapy: Prior Outpatient Therapy: No Does patient have an ACCT team?: No Does patient have Intensive In-House Services?  : No Does patient have [facility] services? : No Does patient have P4CC services?: No  Past Medical History:  Past Medical History:  Diagnosis Date   Depression     Past Surgical History:  Procedure Laterality Date   CHOLECYSTECTOMY     Family History: No family history on file. Family Psychiatric  History: unknown Social History:  Social History   Substance and Sexual Activity  Alcohol Use Not Currently     Social History  Substance and Sexual Activity  Drug Use Not Currently    Social History   Socioeconomic History   Marital status: Legally Separated    Spouse name: Not on file   Number of children: Not on file   Years of education: Not on file   Highest education level: Not on  file  Occupational History   Not on file  Tobacco Use   Smoking status: Current Every Day Smoker   Smokeless tobacco: Never Used  Substance and Sexual Activity   Alcohol use: Not Currently   Drug use: Not Currently   Sexual activity: Not on file  Other Topics Concern   Not on file  Social History Narrative   Not on file   Social Determinants of Health   Financial Resource Strain: Not on file  Food Insecurity: Not on file  Transportation Needs: Not on file  Physical Activity: Not on file  Stress: Not on file  Social Connections: Not on file   Additional Social History:    Allergies:  No Known Allergies  Labs:  Results for orders placed or performed during the hospital encounter of 08/17/20 (from the past 48 hour(s))  Comprehensive metabolic panel     Status: Abnormal   Collection Time: 08/17/20  2:39 PM  Result Value Ref Range   Sodium 134 (L) 135 - 145 mmol/L   Potassium 3.6 3.5 - 5.1 mmol/L   Chloride 99 98 - 111 mmol/L   CO2 24 22 - 32 mmol/L   Glucose, Bld 135 (H) 70 - 99 mg/dL    Comment: Glucose reference range applies only to samples taken after fasting for at least 8 hours.   BUN 9 6 - 20 mg/dL   Creatinine, Ser 0.16 0.61 - 1.24 mg/dL   Calcium 8.9 8.9 - 01.0 mg/dL   Total Protein 6.7 6.5 - 8.1 g/dL   Albumin 3.7 3.5 - 5.0 g/dL   AST 22 15 - 41 U/L   ALT 21 0 - 44 U/L   Alkaline Phosphatase 69 38 - 126 U/L   Total Bilirubin 0.7 0.3 - 1.2 mg/dL   GFR, Estimated >93 >23 mL/min    Comment: (NOTE) Calculated using the CKD-EPI Creatinine Equation (2021)    Anion gap 11 5 - 15    Comment: Performed at [facility], [address] [place], [place] [address]  Ethanol     Status: None   Collection Time: 08/17/20  2:39 PM  Result Value Ref Range   Alcohol, Ethyl (B) <10 <10 mg/dL    Comment: (NOTE) Lowest detectable limit for serum alcohol is 10 mg/dL.  For medical purposes only. Performed at [facility], [address]  [place], [place] [address]   Salicylate level     Status: Abnormal   Collection Time: 08/17/20  2:39 PM  Result Value Ref Range   Salicylate Lvl <7.0 (L) 7.0 - 30.0 mg/dL    Comment: Performed at [facility], [address] [place], [place] [address]  Acetaminophen level     Status: Abnormal   Collection Time: 08/17/20  2:39 PM  Result Value Ref Range   Acetaminophen (Tylenol), Serum <10 (L) 10 - 30 ug/mL    Comment: (NOTE) Therapeutic concentrations vary significantly. A range of 10-30 ug/mL  may be an effective concentration for many patients. However, some  are best treated at concentrations outside of this range. Acetaminophen concentrations >150 ug/mL at 4 hours after ingestion  and >50 ug/mL at 12 hours after  ingestion are often associated with  toxic reactions.  Performed at [facility], [address] [place], [place] [address]   cbc     Status: Abnormal   Collection Time: 08/17/20  2:39 PM  Result Value Ref Range   WBC 17.8 (H) 4.0 - 10.5 K/uL   RBC 5.23 4.22 - 5.81 MIL/uL   Hemoglobin 16.7 13.0 - 17.0 g/dL   HCT 26.7 12.4 - 58.0 %   MCV 87.2 80.0 - 100.0 fL   MCH 31.9 26.0 - 34.0 pg   MCHC 36.6 (H) 30.0 - 36.0 g/dL   RDW 99.8 33.8 - 25.0 %   Platelets 330 150 - 400 K/uL   nRBC 0.0 0.0 - 0.2 %    Comment: Performed at [facility], [address] [place], [place] [address]  Urine Drug Screen, Qualitative     Status: Abnormal   Collection Time: 08/17/20  2:39 PM  Result Value Ref Range   Tricyclic, Ur Screen NONE DETECTED NONE DETECTED   Amphetamines, Ur Screen NONE DETECTED NONE DETECTED   MDMA (Ecstasy)Ur Screen NONE DETECTED NONE DETECTED   Cocaine Metabolite,Ur [place] POSITIVE (A) NONE DETECTED   Opiate, Ur Screen NONE DETECTED NONE DETECTED   Phencyclidine (PCP) Ur S NONE DETECTED NONE DETECTED   Cannabinoid 50 Ng, Ur [place] NONE DETECTED NONE DETECTED   Barbiturates, Ur Screen NONE DETECTED NONE DETECTED   Benzodiazepine, Ur Scrn NONE  DETECTED NONE DETECTED   Methadone Scn, Ur NONE DETECTED NONE DETECTED    Comment: (NOTE) Tricyclics + metabolites, urine    Cutoff 1000 ng/mL Amphetamines + metabolites, urine  Cutoff 1000 ng/mL MDMA (Ecstasy), urine              Cutoff 500 ng/mL Cocaine Metabolite, urine          Cutoff 300 ng/mL Opiate + metabolites, urine        Cutoff 300 ng/mL Phencyclidine (PCP), urine         Cutoff 25 ng/mL Cannabinoid, urine                 Cutoff 50 ng/mL Barbiturates + metabolites, urine  Cutoff 200 ng/mL Benzodiazepine, urine              Cutoff 200 ng/mL Methadone, urine                   Cutoff 300 ng/mL  The urine drug screen provides only a preliminary, unconfirmed analytical test result and should not be used for non-medical purposes. Clinical consideration and professional judgment should be applied to any positive drug screen result due to possible interfering substances. A more specific alternate chemical method must be used in order to obtain a confirmed analytical result. Gas chromatography / mass spectrometry (GC/MS) is the preferred confirm atory method. Performed at [facility], [address] [place], [place] [address]   Resp Panel by RT-PCR (Flu A&B, Covid) Nasopharyngeal Swab     Status: None   Collection Time: 08/17/20  3:45 PM   Specimen: Nasopharyngeal Swab; Nasopharyngeal(NP) swabs in vial transport medium  Result Value Ref Range   SARS Coronavirus 2 by RT PCR NEGATIVE NEGATIVE    Comment: (NOTE) SARS-CoV-2 target nucleic acids are NOT DETECTED.  The SARS-CoV-2 RNA is generally detectable in upper respiratory specimens during the acute phase of infection. The lowest concentration of SARS-CoV-2 viral copies this assay can detect is 138 copies/mL. A negative result does not preclude SARS-Cov-2 infection and should not  be used as the sole basis for treatment or other patient management decisions. A negative result may occur with  improper specimen  collection/handling, submission of specimen other than nasopharyngeal swab, presence of viral mutation(s) within the areas targeted by this assay, and inadequate number of viral copies(<138 copies/mL). A negative result must be combined with clinical observations, patient history, and epidemiological information. The expected result is Negative.  Fact Sheet for Patients:  BloggerCourse.com  Fact Sheet for Healthcare Providers:  SeriousBroker.it  This test is no t yet approved or cleared by the [place] FDA and  has been authorized for detection and/or diagnosis of SARS-CoV-2 by FDA under an Emergency Use Authorization (EUA). This EUA will remain  in effect (meaning this test can be used) for the duration of the COVID-19 declaration under Section 564(b)(1) of the Act, 21 U.S.C.section 360bbb-3(b)(1), unless the authorization is terminated  or revoked sooner.       Influenza A by PCR NEGATIVE NEGATIVE   Influenza B by PCR NEGATIVE NEGATIVE    Comment: (NOTE) The Xpert Xpress SARS-CoV-2/FLU/RSV plus assay is intended as an aid in the diagnosis of influenza from Nasopharyngeal swab specimens and should not be used as a sole basis for treatment. Nasal washings and aspirates are unacceptable for Xpert Xpress SARS-CoV-2/FLU/RSV testing.  Fact Sheet for Patients: BloggerCourse.com  Fact Sheet for Healthcare Providers: SeriousBroker.it  This test is not yet approved or cleared by the [place] FDA and has been authorized for detection and/or diagnosis of SARS-CoV-2 by FDA under an Emergency Use Authorization (EUA). This EUA will remain in effect (meaning this test can be used) for the duration of the COVID-19 declaration under Section 564(b)(1) of the Act, 21 U.S.C. section 360bbb-3(b)(1), unless the authorization is terminated or revoked.  Performed at [facility], [address] [place], [place] [address]     No current facility-administered medications for this encounter.   Current Outpatient Medications  Medication Sig Dispense Refill   Baclofen 5 MG TABS Take 5 mg by mouth 3 (three) times daily as needed. 15 tablet 0   lidocaine (LIDODERM) 5 % Place 1 patch onto the skin every 12 (twelve) hours. Remove & Discard patch within 12 hours or as directed by MD 10 patch 0    Musculoskeletal: Strength & Muscle Tone: within normal limits Gait & Station: normal Patient leans: N/A  Psychiatric Specialty Exam: Physical Exam Vitals and nursing note reviewed.  HENT:     Head: Normocephalic and atraumatic.     Nose: Nose normal.  Eyes:     Pupils: Pupils are equal, round, and reactive to light.  Pulmonary:     Effort: Pulmonary effort is normal.  Musculoskeletal:        General: Normal range of motion.     Cervical back: Normal range of motion.  Skin:    General: Skin is warm and dry.  Neurological:     General: No focal deficit present.     Mental Status: He is alert and oriented to person, place, and time.  Psychiatric:        Attention and Perception: Attention and perception normal.        Mood and Affect: Mood is depressed.        Speech: Speech normal.        Behavior: Behavior normal. Behavior is cooperative.        Thought Content: Thought content includes suicidal ideation.        Cognition and Memory: Cognition  and memory normal.        Judgment: Judgment is impulsive.     Review of Systems  All other systems reviewed and are negative.   Blood pressure 117/61, pulse 74, temperature 97.8 F (36.6 C), temperature source Oral, resp. rate 18, height 5\' 6"  (1.676 m), weight 88.5 kg, SpO2 96 %.Body mass index is 31.47 kg/m.  General Appearance: Casual  Eye Contact:  Minimal  Speech:  Clear and Coherent  Volume:  Normal  Mood:  Depressed and Dysphoric  Affect:  Congruent  Thought Process:  Coherent and Descriptions of  Associations: Intact  Orientation:  Full (Time, Place, and Person)  Thought Content:  WDL  Suicidal Thoughts:  No  Homicidal Thoughts:  No  Memory:  Immediate;   Fair  Judgement:  Fair  Insight:  Fair  Psychomotor Activity:  Normal  Concentration:  Attention Span: Fair  Recall:  [employer]Fair  Fund of Knowledge:  Fair  Language:  Fair  Akathisia:  NA  Handed:  Right  AIMS (if indicated):     Assets:  Communication Skills Desire for Improvement Housing Physical Health Resilience Transportation  ADL's:  Intact  Cognition:  WNL  Sleep:        Treatment Plan Summary: Reassessment in the AM  Chris Leschashaun M Edmund Holcomb, NP 08/18/2020 3:29 AM

## 2020-08-18 NOTE — ED Notes (Signed)
Pt given phone for use.  

## 2020-08-19 ENCOUNTER — Other Ambulatory Visit: Payer: Self-pay | Admitting: Psychiatry

## 2020-10-03 ENCOUNTER — Telehealth: Payer: Self-pay | Admitting: Pharmacist

## 2020-10-03 NOTE — Telephone Encounter (Signed)
Patient failed to provide requested 2021 and 2022 financial documentation. No additional medication assistance will be provided by [facility] without the required proof of income documentation. Patient notified by letter. Debra Cheek Administrative Assistant [facility] 

## 2022-02-19 ENCOUNTER — Encounter: Payer: Self-pay | Admitting: Emergency Medicine

## 2022-02-19 ENCOUNTER — Emergency Department: Payer: Self-pay

## 2022-02-19 ENCOUNTER — Emergency Department
Admission: EM | Admit: 2022-02-19 | Discharge: 2022-02-19 | Disposition: A | Discharge status: Home / Self Care | Payer: Self-pay | Attending: Emergency Medicine | Admitting: Emergency Medicine

## 2022-02-19 DIAGNOSIS — S0003XA Contusion of scalp, initial encounter: Secondary | ICD-10-CM | POA: Insufficient documentation

## 2022-02-19 DIAGNOSIS — Z23 Encounter for immunization: Secondary | ICD-10-CM | POA: Insufficient documentation

## 2022-02-19 DIAGNOSIS — M25512 Pain in left shoulder: Secondary | ICD-10-CM | POA: Insufficient documentation

## 2022-02-19 DIAGNOSIS — X58XXXA Exposure to other specified factors, initial encounter: Secondary | ICD-10-CM | POA: Insufficient documentation

## 2022-02-19 MED ORDER — NAPROXEN 500 MG PO TABS
500.0000 mg | ORAL_TABLET | Freq: Once | ORAL | Status: AC
  Administered 2022-02-19: 500 mg via ORAL
  Filled 2022-02-19: qty 1

## 2022-02-19 MED ORDER — TETANUS-DIPHTHERIA TOXOIDS TD 5-2 LFU IM INJ
0.5000 mL | INJECTION | Freq: Once | INTRAMUSCULAR | Status: AC
  Administered 2022-02-19: 0.5 mL via INTRAMUSCULAR
  Filled 2022-02-19: qty 0.5

## 2022-02-19 NOTE — ED Notes (Signed)
Pt discussed with EDP Ward. See orders for intervention.

## 2022-02-19 NOTE — Discharge Instructions (Signed)
CT scans of your head and neck were okay today.  Your shoulder xray does not show any fracture or dislocation. Take tylenol and/or ibuprofen as needed for pain.    We gave you a tetanus vaccine today to help protect from wound infection.
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
## 2023-01-23 ENCOUNTER — Emergency Department: Payer: Medicaid Other

## 2023-01-23 ENCOUNTER — Encounter: Payer: Self-pay | Admitting: Emergency Medicine

## 2023-01-23 ENCOUNTER — Other Ambulatory Visit: Payer: Self-pay

## 2023-01-23 ENCOUNTER — Emergency Department
Admission: EM | Admit: 2023-01-23 | Discharge: 2023-01-23 | Disposition: A | Discharge status: Home / Self Care | Payer: Medicaid Other | Attending: Emergency Medicine | Admitting: Emergency Medicine

## 2023-01-23 DIAGNOSIS — W293XXA Contact with powered garden and outdoor hand tools and machinery, initial encounter: Secondary | ICD-10-CM | POA: Insufficient documentation

## 2023-01-23 DIAGNOSIS — S61213A Laceration without foreign body of left middle finger without damage to nail, initial encounter: Secondary | ICD-10-CM | POA: Insufficient documentation

## 2023-01-23 DIAGNOSIS — Y92009 Unspecified place in unspecified non-institutional (private) residence as the place of occurrence of the external cause: Secondary | ICD-10-CM | POA: Diagnosis not present

## 2023-01-23 DIAGNOSIS — Y93H2 Activity, gardening and landscaping: Secondary | ICD-10-CM | POA: Insufficient documentation

## 2023-01-23 DIAGNOSIS — S61213D Laceration without foreign body of left middle finger without damage to nail, subsequent encounter: Secondary | ICD-10-CM

## 2023-01-23 DIAGNOSIS — Z23 Encounter for immunization: Secondary | ICD-10-CM | POA: Diagnosis not present

## 2023-01-23 DIAGNOSIS — S6992XA Unspecified injury of left wrist, hand and finger(s), initial encounter: Secondary | ICD-10-CM | POA: Diagnosis present

## 2023-01-23 MED ORDER — LIDOCAINE HCL (PF) 1 % IJ SOLN
5.0000 mL | Freq: Once | INTRAMUSCULAR | Status: AC
  Administered 2023-01-23: 5 mL
  Filled 2023-01-23: qty 5

## 2023-01-23 MED ORDER — CEPHALEXIN 500 MG PO CAPS: 500.0000 mg | ORAL_CAPSULE | Freq: Two times a day (BID) | ORAL | 0 refills | Status: AC

## 2023-01-23 MED ORDER — LIDOCAINE-EPINEPHRINE-TETRACAINE (LET) TOPICAL GEL
3.0000 mL | Freq: Once | TOPICAL | Status: AC
  Administered 2023-01-23: 3 mL via TOPICAL
  Filled 2023-01-23: qty 3

## 2023-01-23 MED ORDER — TETANUS-DIPHTH-ACELL PERTUSSIS 5-2.5-18.5 LF-MCG/0.5 IM SUSY
0.5000 mL | PREFILLED_SYRINGE | Freq: Once | INTRAMUSCULAR | Status: AC
  Administered 2023-01-23: 0.5 mL via INTRAMUSCULAR
  Filled 2023-01-23: qty 0.5

## 2023-01-23 NOTE — ED Provider Notes (Cosign Needed Addendum)
[facility] Emergency Department Provider Note     Event Date/Time   First MD Initiated Contact with Patient 01/23/23 1343     (approximate)   History   Laceration   HPI  Chris Walsh is a 47 y.o. male presents to the emergency department with complaint of a laceration to his left middle finger.  Patient reports he was trimming his hedges at home and incidentally cut his finger. Patient reports immediate bleeding and pain.  Denies numbness, tingling, and loss of mobility.  No other complaints at this time.  Unknown last tetanus shot.      Physical Exam   Triage Vital Signs: ED Triage Vitals  Enc Vitals Group     BP 01/23/23 1302 130/71     Pulse Rate 01/23/23 1302 66     Resp 01/23/23 1302 20     Temp 01/23/23 1302 98.1 F (36.7 C)     Temp src --      SpO2 01/23/23 1302 97 %     Weight 01/23/23 1259 200 lb (90.7 kg)     Height 01/23/23 1259 5\' 6"  (1.676 m)     Head Circumference --      Peak Flow --      Pain Score 01/23/23 1259 4     Pain Loc --      Pain Edu? --      Excl. in GC? --     Most recent vital signs: Vitals:   01/23/23 1302  BP: 130/71  Pulse: 66  Resp: 20  Temp: 98.1 F (36.7 C)  SpO2: 97%    General Awake, no distress. INAD. HEENT NCAT. PERRL. EOMI.  CV:  Good peripheral perfusion.  RESP:  Normal effort. ABD:  No distention.  Other:  ~ 1 cm irregular laceration on right distal 2/3 middle finger on volar aspect. Bleeding controlled. Full AROM.  Neurovascular status intact.  Capillary refill: Less than 2 seconds.  Nailbed still intact.   ED Results / Procedures / Treatments   Labs (all labs ordered are listed, but only abnormal results are displayed) Labs Reviewed - No data to display  RADIOLOGY  I personally viewed and evaluated these images as part of my medical decision making, as well as reviewing the written report by the radiologist.  ED Provider Interpretation: unremarkable. No fracture or  foreign body retained.  DG Hand Complete Left  Result Date: 01/23/2023 CLINICAL DATA:  laceration EXAM: LEFT HAND - COMPLETE 3+ VIEW COMPARISON:  None Available. FINDINGS: Distal middle finger laceration and swelling. No radiopaque foreign body. No acute fracture or joint malalignment. No significant arthropathy. IMPRESSION: Distal middle finger laceration and swelling. No acute fracture or joint malalignment. No radiopaque foreign body. Electronically Signed   By: Feliberto Harts M.D.   On: 01/23/2023 14:54     PROCEDURES:  Critical Care performed: No  ..Laceration Repair  Date/Time: 01/23/2023 10:43 PM  Performed by: Conrad Scurry, PA-C Authorized by: Conrad [place], PA-C   Consent:    Consent obtained:  Verbal   Consent given by:  Patient   Risks discussed:  Infection, pain and retained foreign body Anesthesia:    Anesthesia method:  Topical application, local infiltration and nerve block   Topical anesthetic:  LET   Local anesthetic:  Lidocaine 1% w/o epi   Block needle gauge:  27 G   Block anesthetic:  Lidocaine 1% w/o epi   Block technique:  Transthecal block   Block  injection procedure:  Incremental injection   Block outcome:  Anesthesia achieved Laceration details:    Location:  Finger   Finger location:  L long finger   Length (cm):  1 Treatment:    Area cleansed with:  Povidone-iodine   Amount of cleaning:  Standard   Irrigation solution:  Sterile saline   Irrigation method:  Pressure wash and syringe   Visualized foreign bodies/material removed: no   Skin repair:    Repair method:  Sutures   Suture size:  5-0   Suture material:  Nylon   Suture technique:  Simple interrupted Approximation:    Approximation:  Close Repair type:    Repair type:  Simple Post-procedure details:    Dressing:  Non-adherent dressing   Procedure completion:  Tolerated    MEDICATIONS ORDERED IN ED: Medications  Tdap (BOOSTRIX) injection 0.5 mL (0.5 mLs Intramuscular  Given 01/23/23 1456)  lidocaine (PF) (XYLOCAINE) 1 % injection 5 mL (5 mLs Infiltration Given 01/23/23 1456)  lidocaine-EPINEPHrine-tetracaine (LET) topical gel (3 mLs Topical Given 01/23/23 1513)     IMPRESSION / MDM / ASSESSMENT AND PLAN / ED COURSE  I reviewed the triage vital signs and the nursing notes.                              Differential diagnosis includes, but is not limited to, fracture, tendon injury, foreign body retention, uncomplicated laceration.   Patient's presentation is most consistent with acute complicated illness / injury requiring diagnostic workup.  47 year old male presents to the emergency department for evaluation and treatment of a left finger laceration due to a electric hedge cutter.  See HPI for further details.  See clinical course.  Patient has an unknown status of his tetanus vaccination.  I will order Tdap.  X-ray is reassuring of phalanx fracture or retained foreign body.  There is low suspicion of injury to the tendon as the patient has full range of motion and only localized tenderness to the site of laceration.  Patient's diagnosis is consistent with finger laceration. Patient will be discharged home with prescriptions for Keflex. Patient is to return to the ED or urgent care for suture removal in 10-14 days. Patient is given ED precautions to return to the ED for any worsening or new symptoms.  Clinical Course as of 01/23/23 2223  Sun Jan 23, 2023  1520 X-ray unremarkable.  LET gel applied to finger.  Will perform digital block for to laceration repair. [MH]    Clinical Course User Index [MH] Romeo Apple, Jovanny Stephanie A, PA-C    FINAL CLINICAL IMPRESSION(S) / ED DIAGNOSES   Final diagnoses:  Laceration of left middle finger without foreign body without damage to nail, subsequent encounter     Rx / DC Orders   ED Discharge Orders          Ordered    cephALEXin (KEFLEX) 500 MG capsule  2 times daily        01/23/23 1616              Note:  This document was prepared using Dragon voice recognition software and may include unintentional dictation errors.    Romeo Apple, Jeron Grahn A, PA-C 01/23/23 2223    Romeo Apple, Jadwiga Faidley A, PA-C 01/23/23 2251    Concha Se, MD 01/25/23 [phone]

## 2023-01-23 NOTE — ED Triage Notes (Signed)
Pt reports was trimming hedges and cut his left hand middle finger. Pt reports does not recall last tetanus injection.

## 2023-01-23 NOTE — ED Notes (Signed)
See triage note   Presents with small laceration States he was using hedge trimmers

## 2023-01-23 NOTE — Discharge Instructions (Signed)
Follow up with urgent care or ED for suture removal 10-14 days.  Keep sutures dry for first 24 hrs. Keep suture site of clean & dry. Gently use soap & water after first 24 hrs. DO NOT USE alcohol, hydrogen peroxide etc, to clean skin. You may cover the incision with clean gauze & replace it after your daily shower for your comfort.   If you have been prescribed antibiotics, take them exactly as directed. Do not stop taking them because your symptoms have improved.   Take over-the-counter Tylenol or Ibuprofen with food or snacks as needed for pain. Apply ice to affected area for swelling.   Take care and be patient!

## 2023-03-07 DEATH — deceased

## 2023-04-20 IMAGING — CT CT CERVICAL SPINE W/O CM
3 of 4 series · 9 of 33 positions shown, 11 images · non-contrast
Comparison: Head CT 04/09/2005.

CLINICAL DATA: 46-year-old male with history of worsening headache.
Patient reportedly struck his head on a concrete block more than 10
times.



[Series 6: sagittal bone · sagittal · 0.24mm/px · 5 of 61 slices shown, 6 images]
[im 21/61  bone]
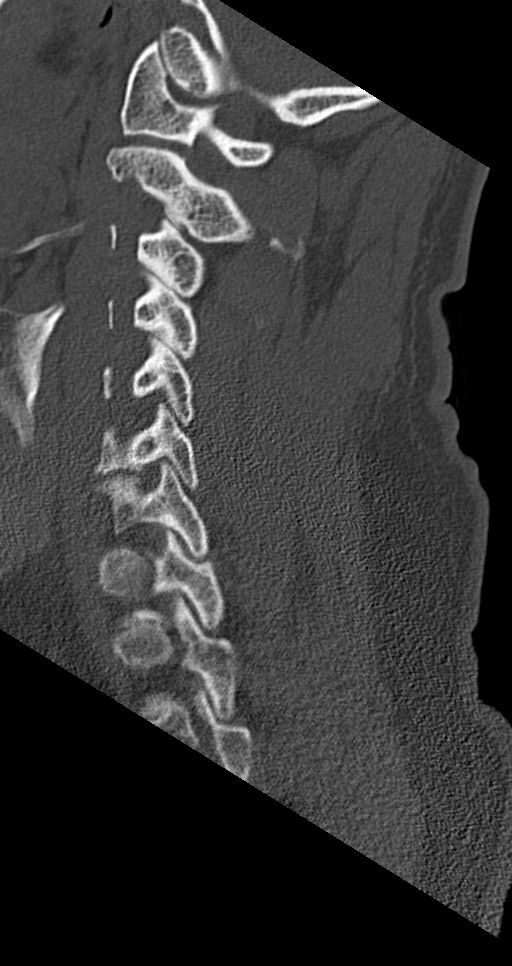
[im 26/61  bone]
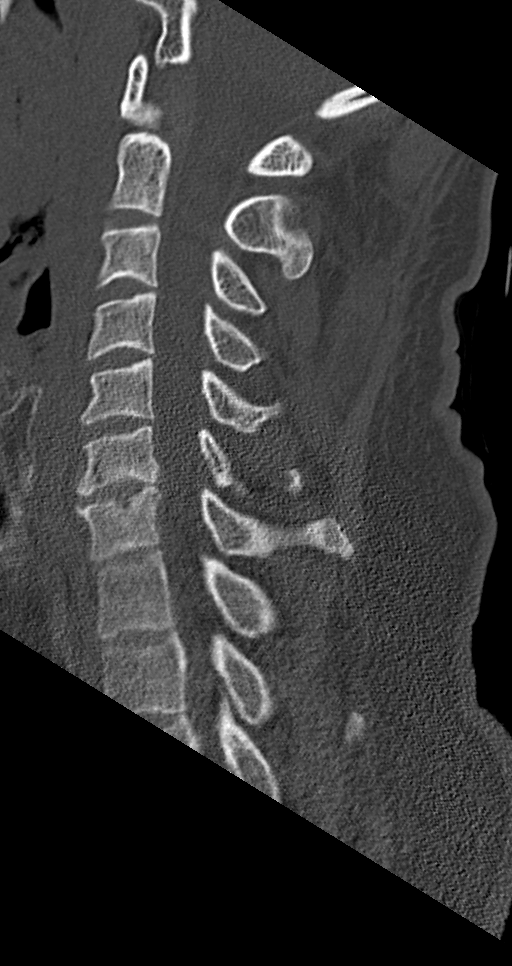
[im 31/61  soft-tissue]
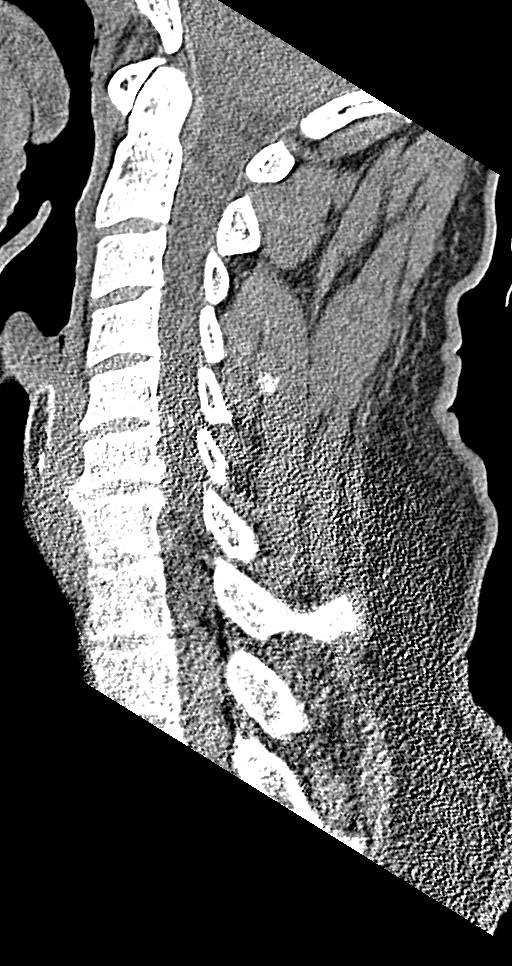
[im 31/61  bone]
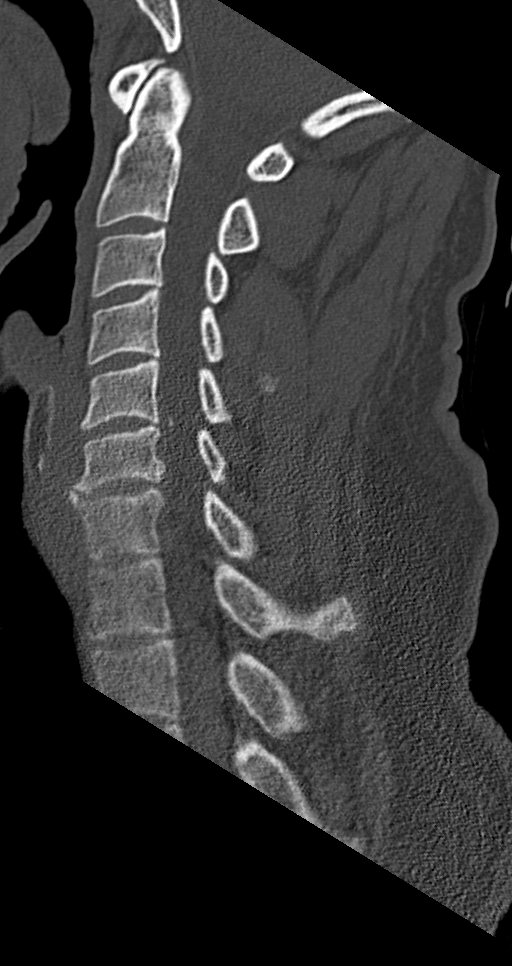
[im 36/61  bone]
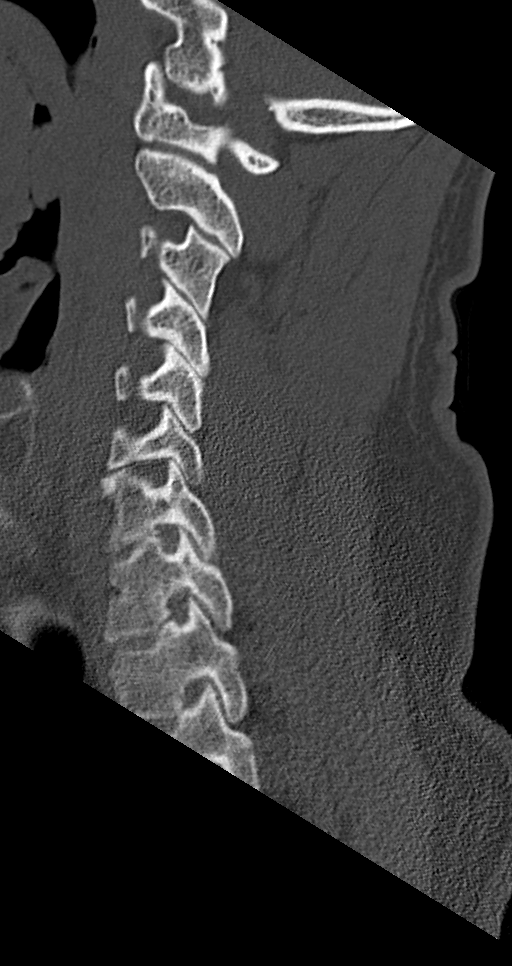
[im 41/61  bone]
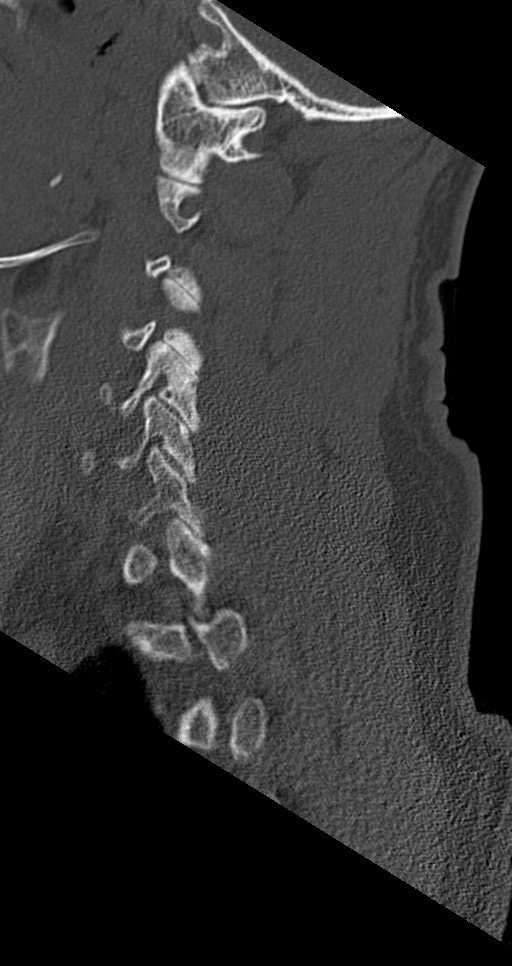

[Series 7: coronal bone · coronal · 0.24mm/px · 3 of 62 slices shown]
[im 17/62  bone]
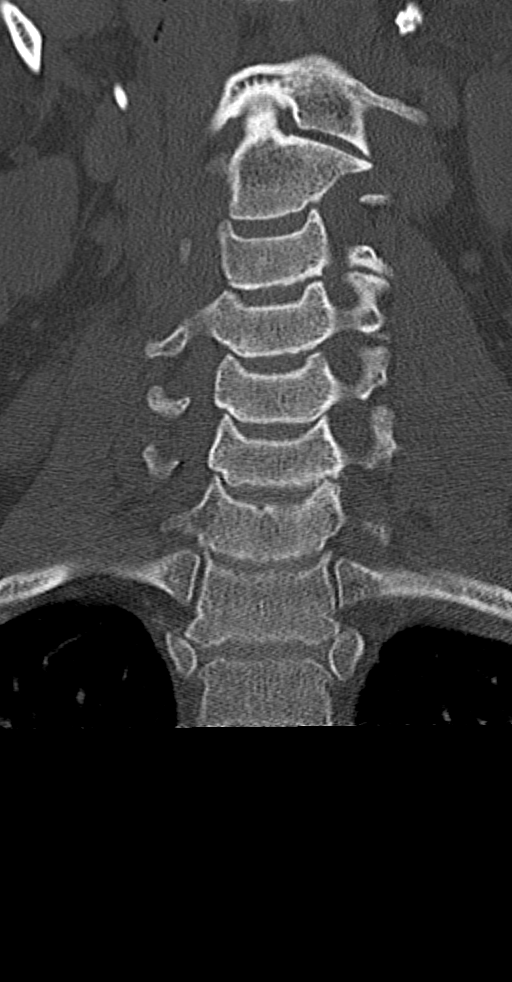
[im 26/62  bone]
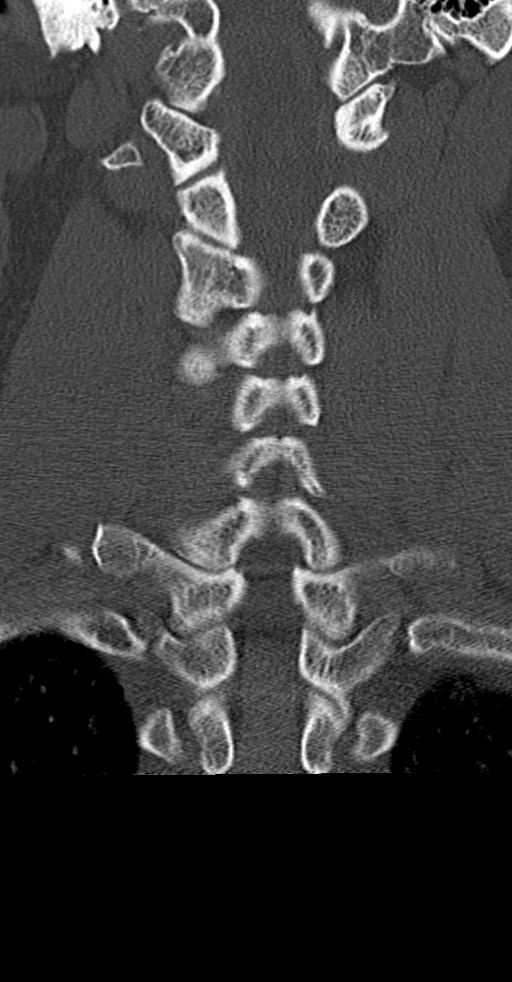
[im 36/62  bone]
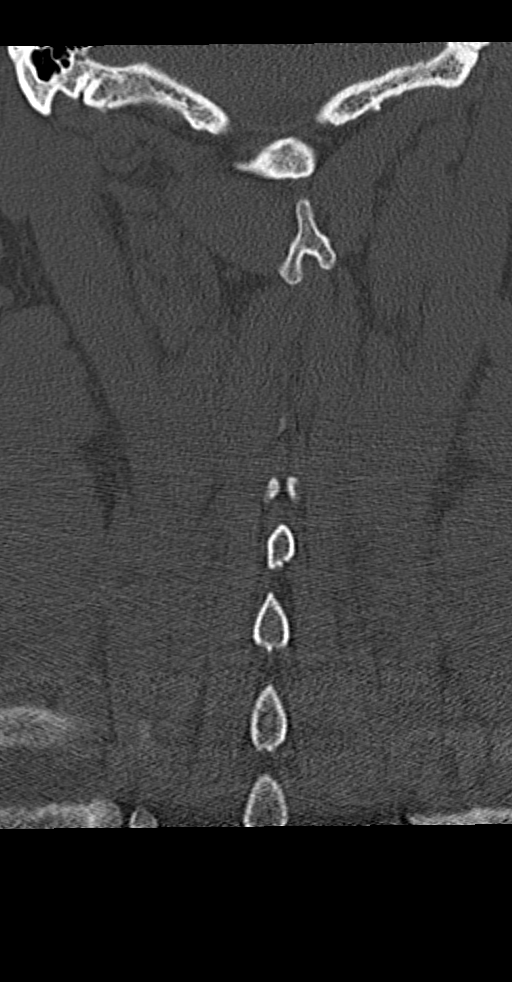

[Series 8: orthogonal bone · axial · 0.24mm/px · z∈[-228,-228]mm · 1 of 115 slices shown, 2 images]
[im 66/115  soft-tissue]
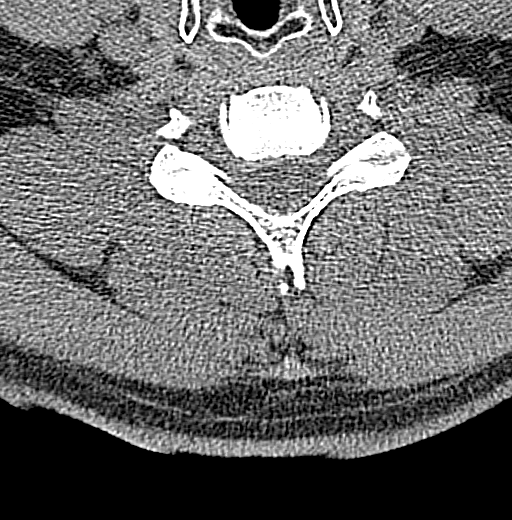
[im 66/115  bone]
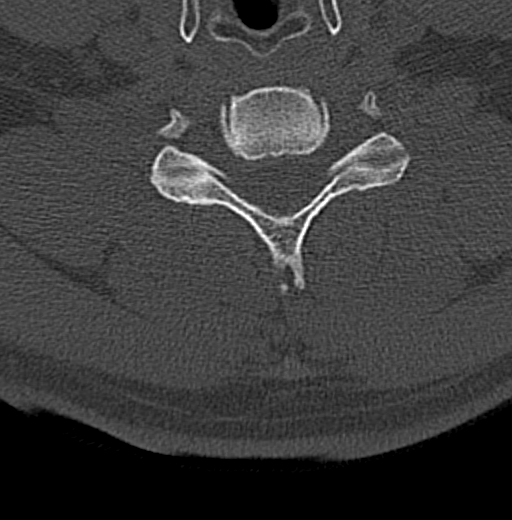

[9 of 33 positions shown; findings below may reference images not displayed]

FINDINGS: CT HEAD FINDINGS

Brain: No evidence of acute infarction, hemorrhage, hydrocephalus,
extra-axial collection or mass lesion/mass effect.

Vascular: No hyperdense vessel or unexpected calcification.

Skull: Normal. Negative for fracture or focal lesion.

Sinuses/Orbits: No acute finding.

Other: None.

CT CERVICAL SPINE FINDINGS

Alignment: Normal.

Skull base and vertebrae: No acute fracture. No primary bone lesion
or focal pathologic process.

Soft tissues and spinal canal: No prevertebral fluid or swelling. No
visible canal hematoma.

Disc levels: Mild multilevel degenerative disc disease, most
pronounced at C6-C7. Mild multilevel facet arthropathy.

Upper chest: Unremarkable.

Other: None.
IMPRESSION: 1. No evidence of significant acute traumatic injury to the skull,
brain or cervical spine.
2. The appearance of the brain is normal.

## 2023-04-20 IMAGING — CT CT HEAD W/O CM
4 series · 16 of 47 positions shown, 18 images · non-contrast
Comparison: Head CT 04/09/2005.

CLINICAL DATA: 46-year-old male with history of worsening headache.
Patient reportedly struck his head on a concrete block more than 10
times.



[Series 2: head bone · axial · 0.41mm/px · z∈[-114,-84]mm · 3 of 76 slices shown]
[im 8/76  bone]
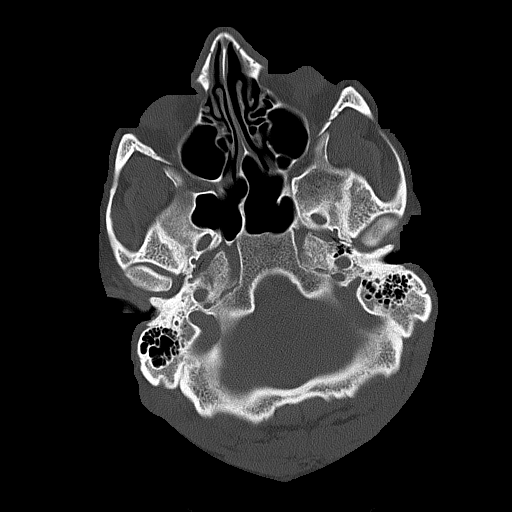
[im 16/76  bone]
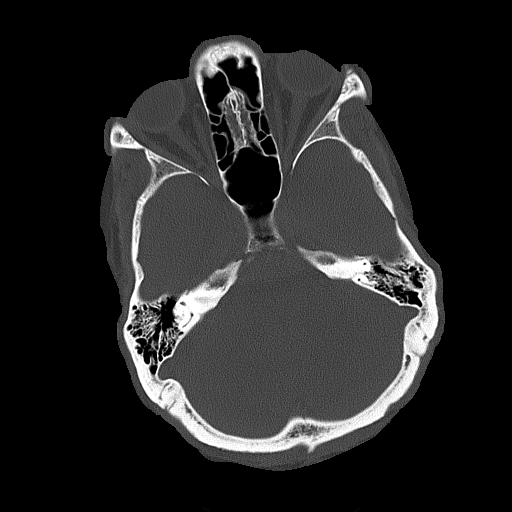
[im 23/76  bone]
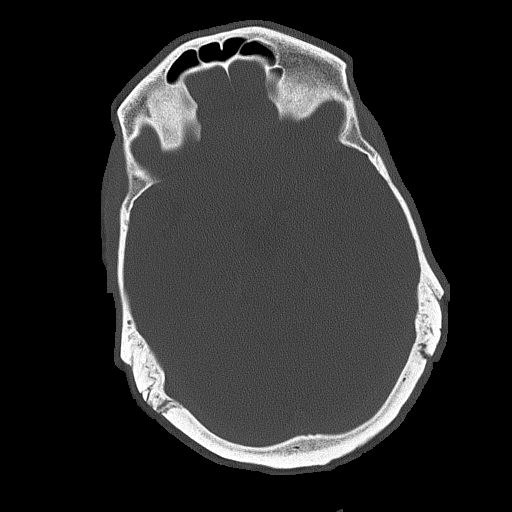

[Series 3: head wo · axial · 0.41mm/px · z∈[-113,+2]mm · 7 of 31 slices shown, 9 images]
[im 4/31  brain]
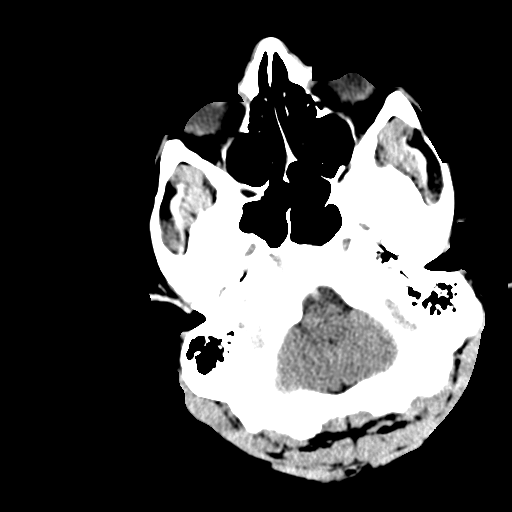
[im 4/31  bone]
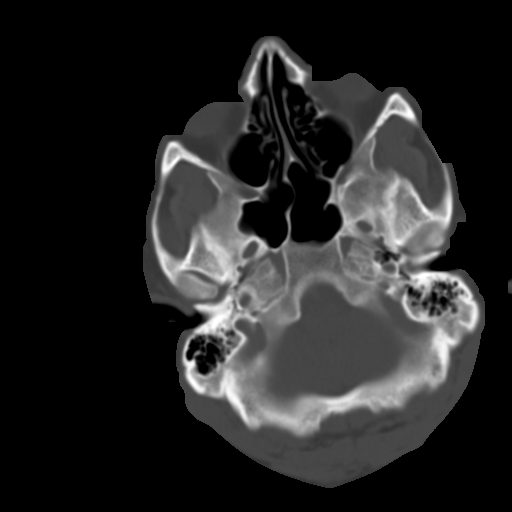
[im 8/31  brain]
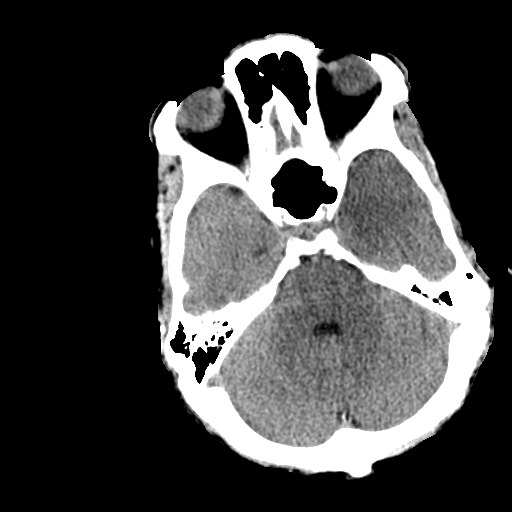
[im 12/31  brain]
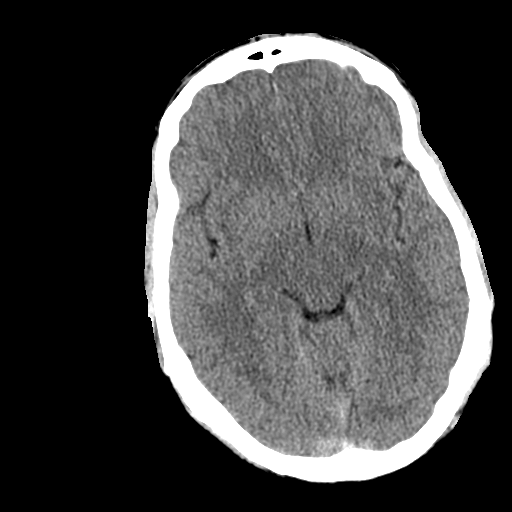
[im 16/31  brain]
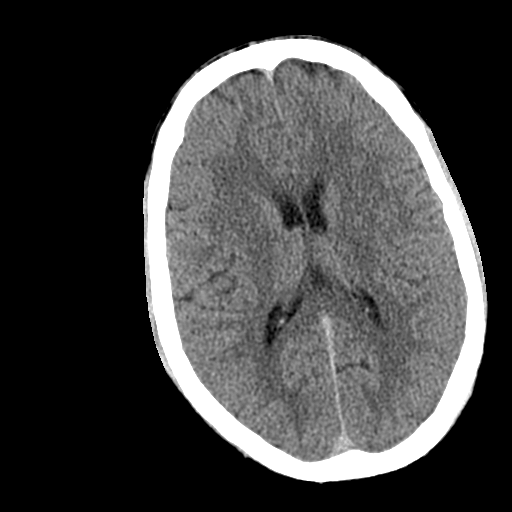
[im 19/31  brain]
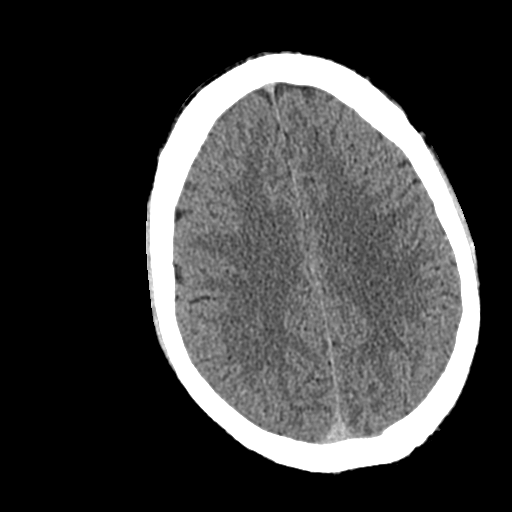
[im 19/31  bone]
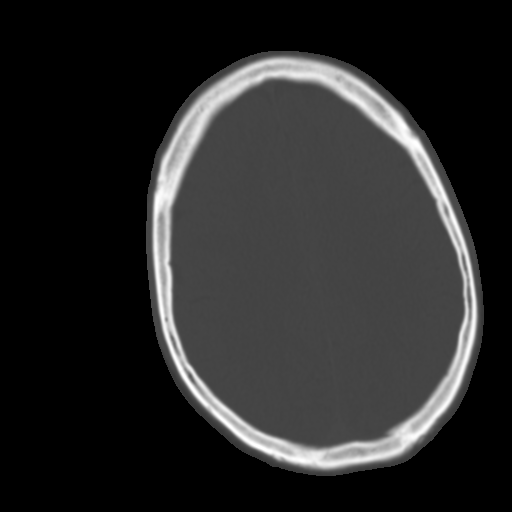
[im 23/31  brain]
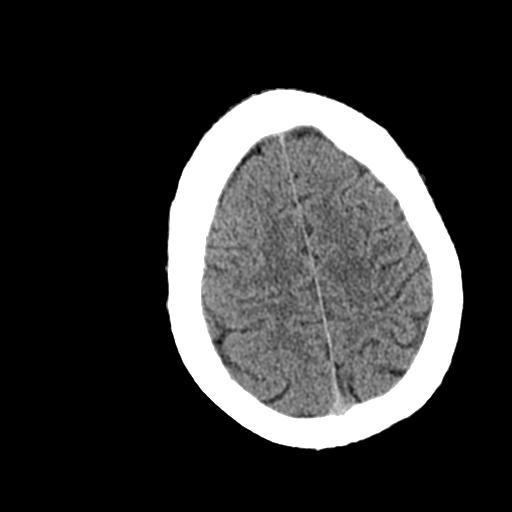
[im 27/31  brain]
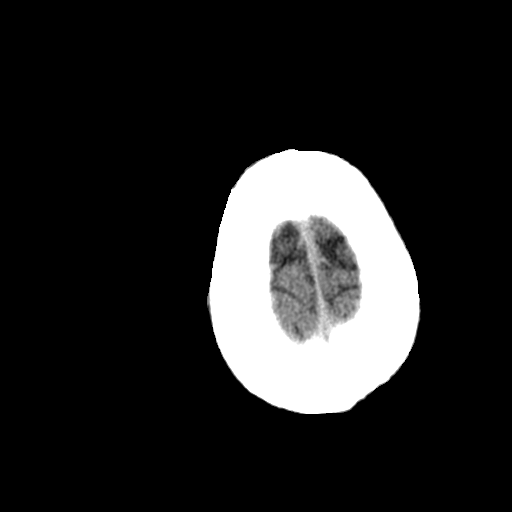

[Series 4: coronal soft tissue · coronal · 0.29mm/px · 3 of 67 slices shown]
[im 23/67  brain]
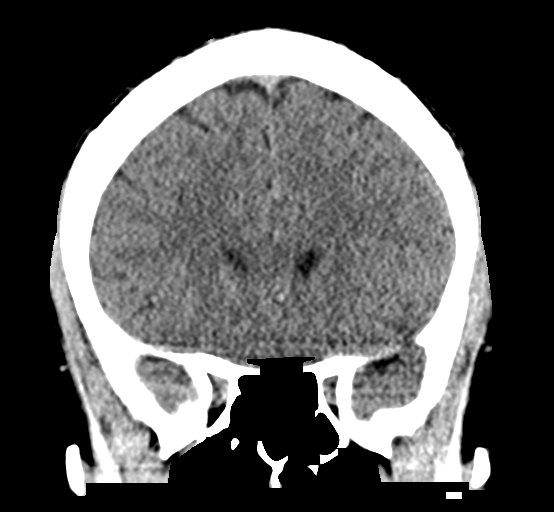
[im 30/67  brain]
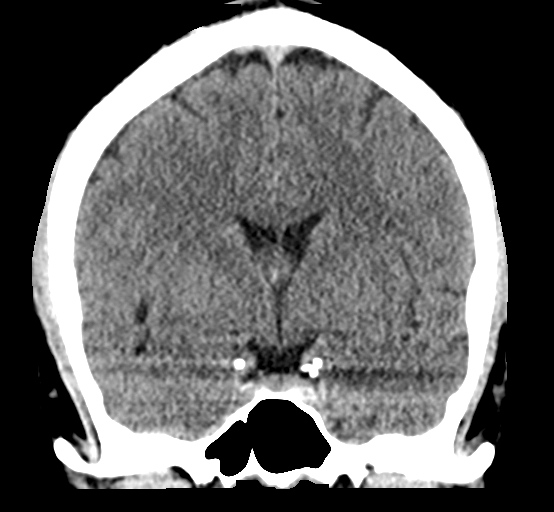
[im 37/67  brain]
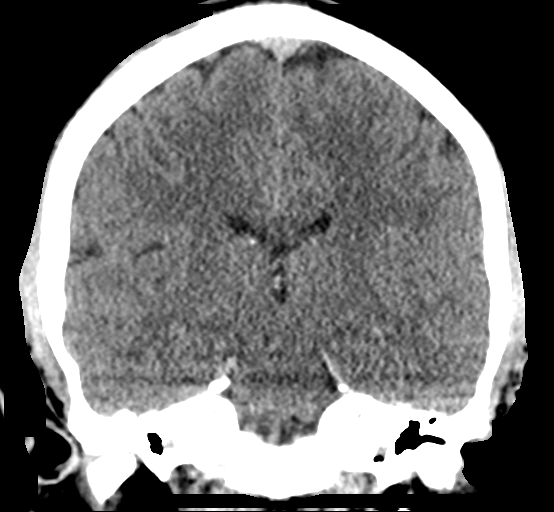

[Series 5: sagittal soft tissue · sagittal · 0.29mm/px · 3 of 53 slices shown]
[im 18/53  brain]
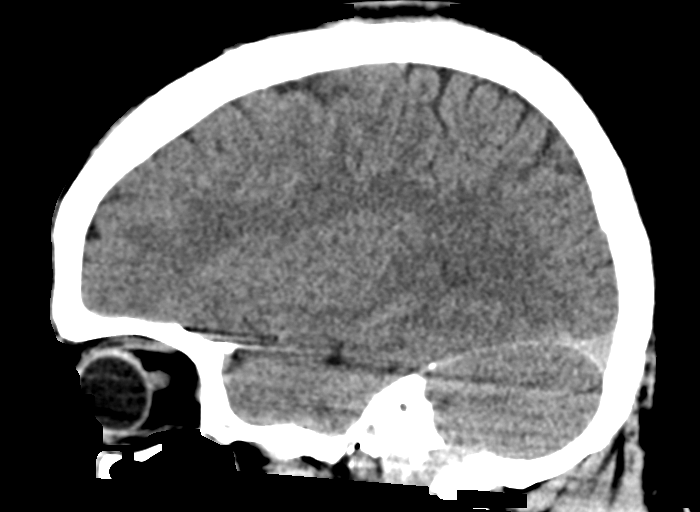
[im 27/53  brain]
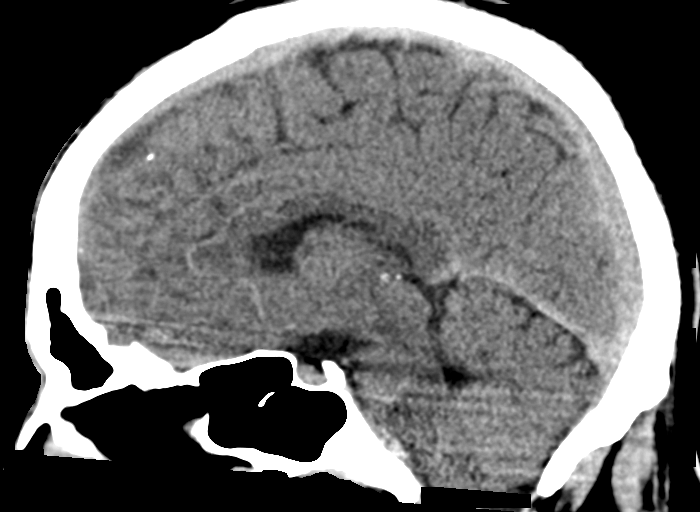
[im 35/53  brain]
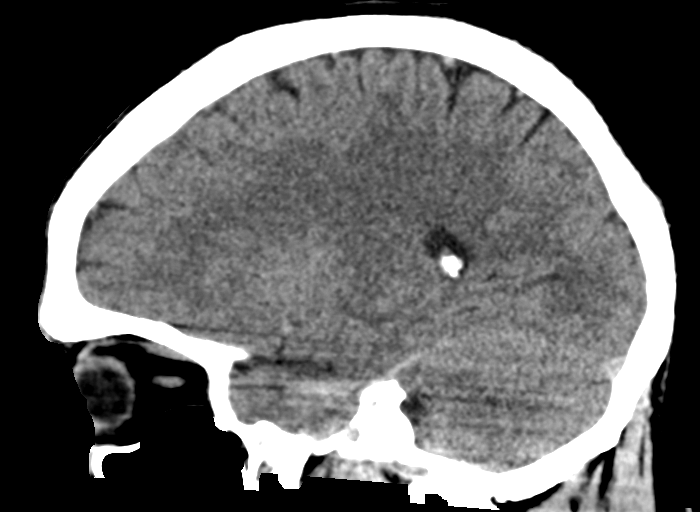

[16 of 47 positions shown; findings below may reference images not displayed]

FINDINGS: CT HEAD FINDINGS

Brain: No evidence of acute infarction, hemorrhage, hydrocephalus,
extra-axial collection or mass lesion/mass effect.

Vascular: No hyperdense vessel or unexpected calcification.

Skull: Normal. Negative for fracture or focal lesion.

Sinuses/Orbits: No acute finding.

Other: None.

CT CERVICAL SPINE FINDINGS

Alignment: Normal.

Skull base and vertebrae: No acute fracture. No primary bone lesion
or focal pathologic process.

Soft tissues and spinal canal: No prevertebral fluid or swelling. No
visible canal hematoma.

Disc levels: Mild multilevel degenerative disc disease, most
pronounced at C6-C7. Mild multilevel facet arthropathy.

Upper chest: Unremarkable.

Other: None.
IMPRESSION: 1. No evidence of significant acute traumatic injury to the skull,
brain or cervical spine.
2. The appearance of the brain is normal.
